# Patient Record
Sex: Female | Born: 1990 | Race: White | Hispanic: No | Marital: Single | Smoking: Current every day smoker
Health system: Southern US, Community
[De-identification: ages and names within clinical notes are randomized; demographics above are authoritative.]

## PROBLEM LIST (undated history)

## (undated) HISTORY — PX: INDUCED ABORTION: SHX677

---
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
## 2012-09-08 ENCOUNTER — Ambulatory Visit: Payer: Self-pay | Admitting: Physician Assistant

## 2012-09-08 VITALS — BP 112/73 | HR 77 | Temp 97.8°F | Resp 16 | Ht 64.0 in | Wt 107.4 lb

## 2012-09-08 DIAGNOSIS — Z20818 Contact with and (suspected) exposure to other bacterial communicable diseases: Secondary | ICD-10-CM

## 2012-09-08 DIAGNOSIS — B86 Scabies: Secondary | ICD-10-CM

## 2012-09-08 MED ORDER — AMOXICILLIN 500 MG PO CAPS: ORAL_CAPSULE | ORAL | Status: DC

## 2012-09-08 MED ORDER — PERMETHRIN 5 % EX CREA: TOPICAL_CREAM | Freq: Once | CUTANEOUS | Status: DC

## 2012-09-08 NOTE — Progress Notes (Signed)
  Subjective:    Patient ID: Vanessa Gallagher, female    DOB: [birth year], 22 y.o.   MRN: [id number]  HPI  22 yr old presents with recurrent scabies. Everyone in the household hasnt been treated at the same time.  She is here with her sister today who is also being seen for sore throat.  Review of Systems  All other systems reviewed and are negative.       Objective:   Physical Exam  Nursing note and vitals reviewed. Constitutional: She is oriented to person, place, and time. She appears well-developed and well-nourished.  HENT:  Head: Normocephalic and atraumatic.  Cardiovascular: Normal rate, regular rhythm and normal heart sounds.   Pulmonary/Chest: Effort normal and breath sounds normal.  Neurological: She is alert and oriented to person, place, and time.  Skin: Rash noted.  Pin point rash and burrows present on fingers and hands        Assessment & Plan:  Scabies- Zyrtec 1 daily for itching. Scabies h/o given. Sister is + for strep pharyngitis.  I am sending Kamren a prescription of amoxicillin to take if she gets symptoms. Meds ordered this encounter  Medications  . permethrin (ELIMITE) 5 % cream    Sig: Apply topically once.    Dispense:  60 g    Refill:  3    Order Specific Question:  Supervising Provider    Answer:  DOOLITTLE, ROBERT P [[id number]]  . amoxicillin (AMOXIL) 500 MG capsule    Sig: 2 tabs bid    Dispense:  40 capsule    Refill:  0    Order Specific Question:  Supervising Provider    Answer:  DOOLITTLE, ROBERT P [[id number]]

## 2012-09-08 NOTE — Patient Instructions (Addendum)
Zyrtec for itching.  Follow scabies h/o instructions.

## 2016-09-24 ENCOUNTER — Encounter (HOSPITAL_COMMUNITY): Payer: Self-pay | Admitting: Family Medicine

## 2016-09-24 ENCOUNTER — Ambulatory Visit (HOSPITAL_COMMUNITY)
Admission: EM | Admit: 2016-09-24 | Discharge: 2016-09-24 | Disposition: A | Discharge status: Home / Self Care | Payer: Medicaid Other | Attending: Internal Medicine | Admitting: Internal Medicine

## 2016-09-24 DIAGNOSIS — Z3202 Encounter for pregnancy test, result negative: Secondary | ICD-10-CM

## 2016-09-24 DIAGNOSIS — N390 Urinary tract infection, site not specified: Secondary | ICD-10-CM

## 2016-09-24 LAB — POCT URINALYSIS DIP (DEVICE)
GLUCOSE, UA: NEGATIVE mg/dL
Ketones, ur: 40 mg/dL — AB
NITRITE: NEGATIVE
Protein, ur: 100 mg/dL — AB
Specific Gravity, Urine: 1.025 (ref 1.005–1.030)
UROBILINOGEN UA: 0.2 mg/dL (ref 0.0–1.0)
pH: 5.5 (ref 5.0–8.0)

## 2016-09-24 LAB — POCT PREGNANCY, URINE: PREG TEST UR: NEGATIVE

## 2016-09-24 MED ORDER — FLUCONAZOLE 150 MG PO TABS: 150.0000 mg | ORAL_TABLET | Freq: Every day | ORAL | 1 refills | Status: DC

## 2016-09-24 MED ORDER — AMOXICILLIN 500 MG PO CAPS: 500.0000 mg | ORAL_CAPSULE | Freq: Three times a day (TID) | ORAL | 0 refills | Status: DC

## 2016-09-24 NOTE — ED Provider Notes (Signed)
CSN: [id number]     Arrival date & time 09/24/16  1718 History   None    Chief Complaint  Patient presents with  . Back Pain  . Chills  . Fever   (Consider location/radiation/quality/duration/timing/severity/associated sxs/prior Treatment) The history is provided by the patient. No language interpreter was used.  Fever  Associated symptoms: dysuria   Dysuria  Pain quality:  Aching Pain severity:  Moderate Onset quality:  Gradual Timing:  Constant Progression:  Worsening Chronicity:  New Recent urinary tract infections: yes   Relieved by:  Nothing Worsened by:  Nothing Ineffective treatments:  None tried Urinary symptoms: frequent urination   Associated symptoms: flank pain   Associated symptoms: no abdominal pain   Risk factors: no sexually transmitted infections   Pt complains of burning with urination  History reviewed. No pertinent past medical history. History reviewed. No pertinent surgical history. History reviewed. No pertinent family history. Social History  Substance Use Topics  . Smoking status: Current Every Day Smoker  . Smokeless tobacco: Never Used  . Alcohol use 3.6 oz/week    6 Cans of beer per week   OB History    Gravida Para Term Preterm AB Living   1             SAB TAB Ectopic Multiple Live Births                 Review of Systems  Gastrointestinal: Negative for abdominal pain.  Genitourinary: Positive for dysuria and flank pain.  All other systems reviewed and are negative.   Allergies  Patient has no known allergies.  Home Medications   Prior to Admission medications   Medication Sig Start Date End Date Taking? Authorizing Provider  amoxicillin (AMOXIL) 500 MG capsule Take 1 capsule (500 mg total) by mouth 3 (three) times daily. 09/24/16   Elson AreasLeslie K Sofia, PA-C   Meds Ordered and Administered this Visit  Medications - No data to display  BP 120/75   Pulse 112   Temp 97.8 F (36.6 C)   Resp 18   LMP 09/03/2016   SpO2 100%  No  data found.   Physical Exam  Constitutional: She is oriented to person, place, and time. She appears well-developed and well-nourished.  HENT:  Head: Normocephalic.  Eyes: EOM are normal.  Neck: Normal range of motion.  Cardiovascular: Normal rate.   Pulmonary/Chest: Effort normal.  Abdominal: Soft. She exhibits no distension. There is no tenderness.  Musculoskeletal: Normal range of motion.  Neurological: She is alert and oriented to person, place, and time.  Psychiatric: She has a normal mood and affect.  Nursing note and vitals reviewed.   Urgent Care Course     Procedures (including critical care time)  Labs Review Labs Reviewed  POCT URINALYSIS DIP (DEVICE) - Abnormal; Notable for the following:       Result Value   Bilirubin Urine SMALL (*)    Ketones, ur 40 (*)    Hgb urine dipstick SMALL (*)    Protein, ur 100 (*)    Leukocytes, UA TRACE (*)    All other components within normal limits  POCT PREGNANCY, URINE    Imaging Review No results found.   Visual Acuity Review  Right Eye Distance:   Left Eye Distance:   Bilateral Distance:    Right Eye Near:   Left Eye Near:    Bilateral Near:         MDM Pt encouraged to drink fluids.  1. Lower urinary tract infectious disease    Meds ordered this encounter  Medications  . amoxicillin (AMOXIL) 500 MG capsule    Sig: Take 1 capsule (500 mg total) by mouth 3 (three) times daily.    Dispense:  30 capsule    Refill:  0    Order Specific Question:   Supervising Provider    Answer:   Linna Hoff [phone]  An After Visit Summary was printed and given to the patient.     Lonia Skinner [place], PA-C 09/24/16 [phone]

## 2016-09-24 NOTE — Discharge Instructions (Signed)
Return if any problems.

## 2016-10-24 ENCOUNTER — Ambulatory Visit (HOSPITAL_COMMUNITY)
Admission: EM | Admit: 2016-10-24 | Discharge: 2016-10-24 | Disposition: A | Discharge status: Home / Self Care | Payer: Self-pay | Attending: Family Medicine | Admitting: Family Medicine

## 2016-10-24 ENCOUNTER — Encounter (HOSPITAL_COMMUNITY): Payer: Self-pay | Admitting: Emergency Medicine

## 2016-10-24 DIAGNOSIS — B86 Scabies: Secondary | ICD-10-CM

## 2016-10-24 MED ORDER — IVERMECTIN 3 MG PO TABS: 12.0000 mg | ORAL_TABLET | ORAL | 0 refills | Status: DC

## 2016-10-24 MED ORDER — PERMETHRIN 5 % EX CREA: TOPICAL_CREAM | CUTANEOUS | 0 refills | Status: DC

## 2016-10-24 MED ORDER — PREDNISONE 20 MG PO TABS: ORAL_TABLET | ORAL | 0 refills | Status: DC

## 2016-10-24 NOTE — ED Provider Notes (Signed)
[facility]    CSN: [id number] Arrival date & time: 10/24/16  1652     History   Chief Complaint Chief Complaint  Patient presents with  . Rash    HPI Vanessa Gallagher is a 26 y.o. female.   Sit 26 year old woman who presents with with an itchy rash.  4 years ago, she was diagnosed with scabies. The rash now involves her forearms, her hands including interdigital spaces, her back, in her abdomen and groin.      History reviewed. No pertinent past medical history.  There are no active problems to display for this patient.   History reviewed. No pertinent surgical history.  OB History    Gravida Para Term Preterm AB Living   1             SAB TAB Ectopic Multiple Live Births                   Home Medications    Prior to Admission medications   Medication Sig Start Date End Date Taking? Authorizing Provider  ivermectin (STROMECTOL) 3 MG TABS tablet Take 4 tablets (12 mg total) by mouth once a week. 10/24/16   Elvina Sidle, MD  permethrin Verner Mould) 5 % cream Apply to affected area once 10/24/16   Elvina Sidle, MD  predniSONE (DELTASONE) 20 MG tablet Two daily with food 10/24/16   Elvina Sidle, MD    Family History No family history on file.  Social History Social History  Substance Use Topics  . Smoking status: Current Every Day Smoker  . Smokeless tobacco: Never Used  . Alcohol use 3.6 oz/week    6 Cans of beer per week     Allergies   Patient has no known allergies.   Review of Systems Review of Systems  Skin: Positive for rash.  All other systems reviewed and are negative.    Physical Exam Triage Vital Signs ED Triage Vitals [10/24/16 1716]  Enc Vitals Group     BP 122/82     Pulse Rate (!) 103     Resp 18     Temp 99 F (37.2 C)     Temp Source Oral     SpO2 (!) 78 %     Weight      Height      Head Circumference      Peak Flow      Pain Score      Pain Loc      Pain Edu?      Excl. in GC?    No data  found.   Updated Vital Signs BP 122/82 (BP Location: Left Arm)   Pulse (!) 103   Temp 99 F (37.2 C) (Oral)   Resp 18   LMP 10/03/2016   SpO2 (!) 78%    Physical Exam  Constitutional: She is oriented to person, place, and time. She appears well-developed and well-nourished.  HENT:  Head: Normocephalic.  Right Ear: External ear normal.  Left Ear: External ear normal.  Mouth/Throat: Oropharynx is clear and moist.  Eyes: Conjunctivae and EOM are normal. Pupils are equal, round, and reactive to light.  Neck: Normal range of motion. Neck supple.  Pulmonary/Chest: Effort normal.  Musculoskeletal: Normal range of motion.  Neurological: She is alert and oriented to person, place, and time.  Skin: Skin is warm and dry. Rash noted.  Linear rash on both forearms, back, and abdomen  Nursing note and vitals reviewed.    UC  Treatments / Results  Labs (all labs ordered are listed, but only abnormal results are displayed) Labs Reviewed - No data to display  EKG  EKG Interpretation None       Radiology No results found.  Procedures Procedures (including critical care time)  Medications Ordered in UC Medications - No data to display   Initial Impression / Assessment and Plan / UC Course  I have reviewed the triage vital signs and the nursing notes.  Pertinent labs & imaging results that were available during my care of the patient were reviewed by me and considered in my medical decision making (see chart for details).     Final Clinical Impressions(s) / UC Diagnoses   Final diagnoses:  Scabies    New Prescriptions New Prescriptions   IVERMECTIN (STROMECTOL) 3 MG TABS TABLET    Take 4 tablets (12 mg total) by mouth once a week.   PERMETHRIN (ELIMITE) 5 % CREAM    Apply to affected area once   PREDNISONE (DELTASONE) 20 MG TABLET    Two daily with food     Elvina Sidle, MD 10/24/16 1727

## 2016-10-24 NOTE — ED Triage Notes (Signed)
Itchy rash that started 2 days ago, reports friend has this .  She has a history of scabies

## 2017-07-15 NOTE — L&D Delivery Note (Addendum)
Delivery Note:  [id number] Called in room to see patient, effective maternal pushing efforts noted. SVE: 10/100/+3, vertex.   [id number] Spontaneous vaginal delivery of liveborn female infant in LOA position. Infant immediately to maternal abdomen. Delayed cord clamping. Three (3) vessel cord, cord blood collected. APGARS: 8, 9. Weight: pending. Receiving nurse present at beside for birth.   Pitocin infusing fundus firn. First degree perineal, left labial, and right periurethral laceration repaired with 3-0 vicryl rapide under epidural anesthesia. Avulsion of cord with gentle traction. Attempt for manual extraction of placenta x 2 without success.   [id number] Dr. Valentino Saxonherry call for manual extraction of placenta.   0502 Dr. Valentino Saxonherry in room for bedside evaluation.   [id number] Manual removal of placenta by Dr. Valentino Saxonherry, see note. 800 mcg cytotec placed rectally.   Vault check completed. Counts correct x 2. QBL: pending.   Initiate routine postpartum care and orders. Mom to postpartum.  Baby to Couplet care / Skin to Skin.  FOB, patient's grandmother, and two (2) doulas present at bedside and overjoyed with the birth of "KimballtonGriffin".    Gunnar BullaJenkins Michelle Lawhorn, CNM [facility], [id number] 02/05/2018, 5:44 AM

## 2017-08-25 ENCOUNTER — Ambulatory Visit (INDEPENDENT_AMBULATORY_CARE_PROVIDER_SITE_OTHER): Payer: Medicaid Other | Admitting: Certified Nurse Midwife

## 2017-08-25 ENCOUNTER — Encounter: Payer: Self-pay | Admitting: Certified Nurse Midwife

## 2017-08-25 ENCOUNTER — Encounter: Payer: Self-pay | Admitting: Advanced Practice Midwife

## 2017-08-25 ENCOUNTER — Other Ambulatory Visit: Payer: Self-pay | Admitting: Certified Nurse Midwife

## 2017-08-25 ENCOUNTER — Ambulatory Visit (INDEPENDENT_AMBULATORY_CARE_PROVIDER_SITE_OTHER): Payer: Medicaid Other

## 2017-08-25 VITALS — BP 114/74 | HR 98 | Wt 127.7 lb

## 2017-08-25 DIAGNOSIS — O0930 Supervision of pregnancy with insufficient antenatal care, unspecified trimester: Secondary | ICD-10-CM

## 2017-08-25 DIAGNOSIS — Z23 Encounter for immunization: Secondary | ICD-10-CM

## 2017-08-25 DIAGNOSIS — Z3689 Encounter for other specified antenatal screening: Secondary | ICD-10-CM | POA: Diagnosis not present

## 2017-08-25 DIAGNOSIS — Z3492 Encounter for supervision of normal pregnancy, unspecified, second trimester: Secondary | ICD-10-CM | POA: Diagnosis not present

## 2017-08-25 DIAGNOSIS — Z124 Encounter for screening for malignant neoplasm of cervix: Secondary | ICD-10-CM

## 2017-08-25 NOTE — Progress Notes (Signed)
[facility]B -[facility]- pt is doing well, no records with her today

## 2017-08-25 NOTE — Patient Instructions (Signed)
Round Ligament Pain The round ligament is a cord of muscle and tissue that helps to support the uterus. It can become a source of pain during pregnancy if it becomes stretched or twisted as the baby grows. The pain usually begins in the second trimester of pregnancy, and it can come and go until the baby is delivered. It is not a serious problem, and it does not cause harm to the baby. Round ligament pain is usually a short, sharp, and pinching pain, but it can also be a dull, lingering, and aching pain. The pain is felt in the lower side of the abdomen or in the groin. It usually starts deep in the groin and moves up to the outside of the hip area. Pain can occur with:  A sudden change in position.  Rolling over in bed.  Coughing or sneezing.  Physical activity.  Follow these instructions at home: Watch your condition for any changes. Take these steps to help with your pain:  When the pain starts, relax. Then try: ? Sitting down. ? Flexing your knees up to your abdomen. ? Lying on your side with one pillow under your abdomen and another pillow between your legs. ? Sitting in a warm bath for 15-20 minutes or until the pain goes away.  Take over-the-counter and prescription medicines only as told by your health care provider.  Move slowly when you sit and stand.  Avoid long walks if they cause pain.  Stop or lessen your physical activities if they cause pain.  Contact a health care provider if:  Your pain does not go away with treatment.  You feel pain in your back that you did not have before.  Your medicine is not helping. Get help right away if:  You develop a fever or chills.  You develop uterine contractions.  You develop vaginal bleeding.  You develop nausea or vomiting.  You develop diarrhea.  You have pain when you urinate. This information is not intended to replace advice given to you by your health care provider. Make sure you discuss any questions you have  with your health care provider. Document Released: 04/09/2008 Document Revised: 12/07/2015 Document Reviewed: 09/07/2014 Elsevier Interactive Patient Education  2018 [place] for Pregnant Women While you are pregnant, your body will require additional nutrition to help support your growing baby. It is recommended that you consume:  150 additional calories each day during your first trimester.  300 additional calories each day during your second trimester.  300 additional calories each day during your third trimester.  Eating a healthy, well-balanced diet is very important for your health and for your baby's health. You also have a higher need for some vitamins and minerals, such as folic acid, calcium, iron, and vitamin D. What do I need to know about eating during pregnancy?  Do not try to lose weight or go on a diet during pregnancy.  Choose healthy, nutritious foods. Choose  of a sandwich with a glass of milk instead of a candy bar or a high-calorie sugar-sweetened beverage.  Limit your overall intake of foods that have "empty calories." These are foods that have little nutritional value, such as sweets, desserts, candies, sugar-sweetened beverages, and fried foods.  Eat a variety of foods, especially fruits and vegetables.  Take a prenatal vitamin to help meet the additional needs during pregnancy, specifically for folic acid, iron, calcium, and vitamin D.  Remember to stay active. Ask your health care provider for exercise recommendations  that are specific to you.  Practice good food safety and cleanliness, such as washing your hands before you eat and after you prepare raw meat. This helps to prevent foodborne illnesses, such as listeriosis, that can be very dangerous for your baby. Ask your health care provider for more information about listeriosis. What does 150 extra calories look like? Healthy options for an additional 150 calories each day could be any of  the following:  Plain low-fat yogurt (6-8 oz) with  cup of berries.  1 apple with 2 teaspoons of peanut butter.  Cut-up vegetables with  cup of hummus.  Low-fat chocolate milk (8 oz or 1 cup).  1 string cheese with 1 medium orange.   of a peanut butter and jelly sandwich on whole-wheat bread (1 tsp of peanut butter).  For 300 calories, you could eat two of those healthy options each day. What is a healthy amount of weight to gain? The recommended amount of weight for you to gain is based on your pre-pregnancy BMI. If your pre-pregnancy BMI was:  Less than 18 (underweight), you should gain 28-40 lb.  18-24.9 (normal), you should gain 25-35 lb.  25-29.9 (overweight), you should gain 15-25 lb.  Greater than 30 (obese), you should gain 11-20 lb.  What if I am having twins or multiples? Generally, pregnant women who will be having twins or multiples may need to increase their daily calories by 300-600 calories each day. The recommended range for total weight gain is 25-54 lb, depending on your pre-pregnancy BMI. Talk with your health care provider for specific guidance about additional nutritional needs, weight gain, and exercise during your pregnancy. What foods can I eat? Grains Any grains. Try to choose whole grains, such as whole-wheat bread, oatmeal, or brown rice. Vegetables Any vegetables. Try to eat a variety of colors and types of vegetables to get a full range of vitamins and minerals. Remember to wash your vegetables well before eating. Fruits Any fruits. Try to eat a variety of colors and types of fruit to get a full range of vitamins and minerals. Remember to wash your fruits well before eating. Meats and Other Protein Sources Lean meats, including chicken, [place], fish, and lean cuts of beef, veal, or pork. Make sure that all meats are cooked to "well done." Tofu. Tempeh. Beans. Eggs. Peanut butter and other nut butters. Seafood, such as shrimp, crab, and lobster. If  you choose fish, select types that are higher in omega-3 fatty acids, including salmon, herring, mussels, trout, sardines, and pollock. Make sure that all meats are cooked to food-safe temperatures. Dairy Pasteurized milk and milk alternatives. Pasteurized yogurt and pasteurized cheese. Cottage cheese. Sour cream. Beverages Water. Juices that contain 100% fruit juice or vegetable juice. Caffeine-free teas and decaffeinated coffee. Drinks that contain caffeine are okay to drink, but it is better to avoid caffeine. Keep your total caffeine intake to less than 200 mg each day (12 oz of coffee, tea, or soda) or as directed by your health care provider. Condiments Any pasteurized condiments. Sweets and Desserts Any sweets and desserts. Fats and Oils Any fats and oils. The items listed above may not be a complete list of recommended foods or beverages. Contact your dietitian for more options. What foods are not recommended? Vegetables Unpasteurized (raw) vegetable juices. Fruits Unpasteurized (raw) fruit juices. Meats and Other Protein Sources Cured meats that have nitrates, such as bacon, salami, and hotdogs. Luncheon meats, bologna, or other deli meats (unless they are reheated until they  are steaming hot). Refrigerated pate, meat spreads from a meat counter, smoked seafood that is found in the refrigerated section of a store. Raw fish, such as sushi or sashimi. High mercury content fish, such as tilefish, shark, swordfish, and king mackerel. Raw meats, such as tuna or beef tartare. Undercooked meats and poultry. Make sure that all meats are cooked to food-safe temperatures. Dairy Unpasteurized (raw) milk and any foods that have raw milk in them. Soft cheeses, such as feta, queso blanco, queso fresco, Brie, Camembert cheeses, blue-veined cheeses, and Panela cheese (unless it is made with pasteurized milk, which must be stated on the label). Beverages Alcohol. Sugar-sweetened beverages, such as  sodas, teas, or energy drinks. Condiments Homemade fermented foods and drinks, such as pickles, sauerkraut, or kombucha drinks. (Store-bought pasteurized versions of these are okay.) Other Salads that are made in the store, such as ham salad, chicken salad, egg salad, tuna salad, and seafood salad. The items listed above may not be a complete list of foods and beverages to avoid. Contact your dietitian for more information. This information is not intended to replace advice given to you by your health care provider. Make sure you discuss any questions you have with your health care provider. Document Released: 04/15/2014 Document Revised: 12/07/2015 Document Reviewed: 12/14/2013 Elsevier Interactive Patient Education  2018 [place]. Back Pain in Pregnancy Back pain during pregnancy is common. Back pain may be caused by several factors that are related to changes during your pregnancy. Follow these instructions at home: Managing pain, stiffness, and swelling  If directed, apply ice for sudden (acute) back pain. ? Put ice in a plastic bag. ? Place a towel between your skin and the bag. ? Leave the ice on for 20 minutes, 2-3 times per day.  If directed, apply heat to the affected area before you exercise: ? Place a towel between your skin and the heat pack or heating pad. ? Leave the heat on for 20-30 minutes. ? Remove the heat if your skin turns bright red. This is especially important if you are unable to feel pain, heat, or cold. You may have a greater risk of getting burned. Activity  Exercise as told by your health care provider. Exercising is the best way to prevent or manage back pain.  Listen to your body when lifting. If lifting hurts, ask for help or bend your knees. This uses your leg muscles instead of your back muscles.  Squat down when picking up something from the floor. Do not bend over.  Only use bed rest as told by your health care provider. Bed rest should only be  used for the most severe episodes of back pain. Standing, Sitting, and Lying Down  Do not stand in one place for long periods of time.  Use good posture when sitting. Make sure your head rests over your shoulders and is not hanging forward. Use a pillow on your lower back if necessary.  Try sleeping on your side, preferably the left side, with a pillow or two between your legs. If you are sore after a night's rest, your bed may be too soft. A firm mattress may provide more support for your back during pregnancy. General instructions  Do not wear high heels.  Eat a healthy diet. Try to gain weight within your health care provider's recommendations.  Use a maternity girdle, elastic sling, or back brace as told by your health care provider.  Take over-the-counter and prescription medicines only as told by your  health care provider.  Keep all follow-up visits as told by your health care provider. This is important. This includes any visits with any specialists, such as a physical therapist. Contact a health care provider if:  Your back pain interferes with your daily activities.  You have increasing pain in other parts of your body. Get help right away if:  You develop numbness, tingling, weakness, or problems with the use of your arms or legs.  You develop severe back pain that is not controlled with medicine.  You have a sudden change in bowel or bladder control.  You develop shortness of breath, dizziness, or you faint.  You develop nausea, vomiting, or sweating.  You have back pain that is a rhythmic, cramping pain similar to labor pains. Labor pain is usually 1-2 minutes apart, lasts for about 1 minute, and involves a bearing down feeling or pressure in your pelvis.  You have back pain and your water breaks or you have vaginal bleeding.  You have back pain or numbness that travels down your leg.  Your back pain developed after you fell.  You develop pain on one side of  your back.  You see blood in your urine.  You develop skin blisters in the area of your back pain. This information is not intended to replace advice given to you by your health care provider. Make sure you discuss any questions you have with your health care provider. Document Released: 10/09/2005 Document Revised: 12/07/2015 Document Reviewed: 03/15/2015 Elsevier Interactive Patient Education  2018 [employer]. Morning Sickness Morning sickness is when you feel sick to your stomach (nauseous) during pregnancy. You may feel sick to your stomach and throw up (vomit). You may feel sick in the morning, but you can feel this way any time of day. Some women feel very sick to their stomach and cannot stop throwing up (hyperemesis gravidarum). Follow these instructions at home:  Only take medicines as told by your doctor.  Take multivitamins as told by your doctor. Taking multivitamins before getting pregnant can stop or lessen the harshness of morning sickness.  Eat dry toast or unsalted crackers before getting out of bed.  Eat 5 to 6 small meals a day.  Eat dry and bland foods like rice and baked potatoes.  Do not drink liquids with meals. Drink between meals.  Do not eat greasy, fatty, or spicy foods.  Have someone cook for you if the smell of food causes you to feel sick or throw up.  If you feel sick to your stomach after taking prenatal vitamins, take them at night or with a snack.  Eat protein when you need a snack (nuts, yogurt, cheese).  Eat unsweetened gelatins for dessert.  Wear a bracelet used for sea sickness (acupressure wristband).  Go to a doctor that puts thin needles into certain body points (acupuncture) to improve how you feel.  Do not smoke.  Use a humidifier to keep the air in your house free of odors.  Get lots of fresh air. Contact a doctor if:  You need medicine to feel better.  You feel dizzy or lightheaded.  You are losing weight. Get help  right away if:  You feel very sick to your stomach and cannot stop throwing up.  You pass out (faint). This information is not intended to replace advice given to you by your health care provider. Make sure you discuss any questions you have with your health care provider. Document Released: 08/08/2004 Document Revised: 12/07/2015  Document Reviewed: 12/16/2012 Elsevier Interactive Patient Education  2017 [place]. Common Medications Safe in Pregnancy  Acne:      Constipation:  Benzoyl Peroxide     Colace  Clindamycin      Dulcolax Suppository  Topica Erythromycin     Fibercon  Salicylic Acid      Metamucil         Miralax AVOID:        Senakot   Accutane    Cough:  Retin-A       Cough Drops  Tetracycline      Phenergan w/ Codeine if Rx  Minocycline      Robitussin (Plain & DM)  Antibiotics:     Crabs/Lice:  Ceclor       RID  Cephalosporins    AVOID:  E-Mycins      Kwell  Keflex  Macrobid/Macrodantin   Diarrhea:  Penicillin      Kao-Pectate  Zithromax      Imodium AD         PUSH FLUIDS AVOID:       Cipro     Fever:  Tetracycline      Tylenol (Regular or Extra  Minocycline       Strength)  Levaquin      Extra Strength-Do not          Exceed 8 tabs/24 hrs Caffeine:        <272m/day (equiv. To 1 cup of coffee or  approx. 3 12 oz sodas)         Gas: Cold/Hayfever:       Gas-X  Benadryl      Mylicon  Claritin       Phazyme  **Claritin-D        Chlor-Trimeton    Headaches:  Dimetapp      ASA-Free Excedrin  Drixoral-Non-Drowsy     Cold Compress  Mucinex (Guaifenasin)     Tylenol (Regular or Extra  Sudafed/Sudafed-12 Hour     Strength)  **Sudafed PE Pseudoephedrine   Tylenol Cold & Sinus     Vicks Vapor Rub  Zyrtec  **AVOID if Problems With Blood Pressure         Heartburn: Avoid lying down for at least 1 hour after meals  Aciphex      Maalox     Rash:  Milk of Magnesia     Benadryl    Mylanta       1% Hydrocortisone Cream  Pepcid  Pepcid  Complete   Sleep Aids:  Prevacid      Ambien   Prilosec       Benadryl  Rolaids       Chamomile Tea  Tums (Limit 4/day)     Unisom  Zantac       Tylenol PM         Warm milk-add vanilla or  Hemorrhoids:       Sugar for taste  Anusol/Anusol H.C.  (RX: Analapram 2.5%)  Sugar Substitutes:  Hydrocortisone OTC     Ok in moderation  Preparation H      Tucks        Vaseline lotion applied to tissue with wiping    Herpes:     Throat:  Acyclovir      Oragel  Famvir  Valtrex     Vaccines:         Flu Shot Leg Cramps:       *Gardasil  Benadryl      Hepatitis A  Hepatitis B Nasal Spray:       Pneumovax  Saline Nasal Spray     Polio Booster         Tetanus Nausea:       Tuberculosis test or PPD  Vitamin B6 25 mg TID   AVOID:    Dramamine      *Gardasil  Emetrol       Live Poliovirus  Ginger Root 250 mg QID    MMR (measles, mumps &  High Complex Carbs @ Bedtime    rebella)  Sea Bands-Accupressure    Varicella (Chickenpox)  Unisom 1/2 tab TID     *No known complications           If received before Pain:         Known pregnancy;   Darvocet       Resume series after  Lortab        Delivery  Percocet    Yeast:   Tramadol      Femstat  Tylenol 3      Gyne-lotrimin  Ultram       Monistat  Vicodin           MISC:         All Sunscreens           Hair Coloring/highlights          Insect Repellant's          (Including DEET)         Mystic Tans Second Trimester of Pregnancy The second trimester is from week 13 through week 28, month 4 through 6. This is often the time in pregnancy that you feel your best. Often times, morning sickness has lessened or quit. You may have more energy, and you may get hungry more often. Your unborn baby (fetus) is growing rapidly. At the end of the sixth month, he or she is about 9 inches long and weighs about 1 pounds. You will likely feel the baby move (quickening) between 18 and 20 weeks of pregnancy. Follow these instructions at home:  Avoid  all smoking, herbs, and alcohol. Avoid drugs not approved by your doctor.  Do not use any tobacco products, including cigarettes, chewing tobacco, and electronic cigarettes. If you need help quitting, ask your doctor. You may get counseling or other support to help you quit.  Only take medicine as told by your doctor. Some medicines are safe and some are not during pregnancy.  Exercise only as told by your doctor. Stop exercising if you start having cramps.  Eat regular, healthy meals.  Wear a good support bra if your breasts are tender.  Do not use hot tubs, steam rooms, or saunas.  Wear your seat belt when driving.  Avoid raw meat, uncooked cheese, and liter boxes and soil used by cats.  Take your prenatal vitamins.  Take 1500-2000 milligrams of calcium daily starting at the 20th week of pregnancy until you deliver your baby.  Try taking medicine that helps you poop (stool softener) as needed, and if your doctor approves. Eat more fiber by eating fresh fruit, vegetables, and whole grains. Drink enough fluids to keep your pee (urine) clear or pale yellow.  Take warm water baths (sitz baths) to soothe pain or discomfort caused by hemorrhoids. Use hemorrhoid cream if your doctor approves.  If you have puffy, bulging veins (varicose veins), wear support hose. Raise (elevate) your feet for 15 minutes, 3-4 times a day. Limit salt in your diet.  Avoid heavy lifting, wear low heals, and sit up straight.  Rest with your legs raised if you have leg cramps or low back pain.  Visit your dentist if you have not gone during your pregnancy. Use a soft toothbrush to brush your teeth. Be gentle when you floss.  You can have sex (intercourse) unless your doctor tells you not to.  Go to your doctor visits. Get help if:  You feel dizzy.  You have mild cramps or pressure in your lower belly (abdomen).  You have a nagging pain in your belly area.  You continue to feel sick to your stomach  (nauseous), throw up (vomit), or have watery poop (diarrhea).  You have bad smelling fluid coming from your vagina.  You have pain with peeing (urination). Get help right away if:  You have a fever.  You are leaking fluid from your vagina.  You have spotting or bleeding from your vagina.  You have severe belly cramping or pain.  You lose or gain weight rapidly.  You have trouble catching your breath and have chest pain.  You notice sudden or extreme puffiness (swelling) of your face, hands, ankles, feet, or legs.  You have not felt the baby move in over an hour.  You have severe headaches that do not go away with medicine.  You have vision changes. This information is not intended to replace advice given to you by your health care provider. Make sure you discuss any questions you have with your health care provider. Document Released: 09/25/2009 Document Revised: 12/07/2015 Document Reviewed: 09/01/2012 Elsevier Interactive Patient Education  2017 [employer].

## 2017-08-25 NOTE — Progress Notes (Signed)
TRANSFER IN OB HISTORY AND PHYSICAL  SUBJECTIVE:       Vanessa Gallagher is a 27 y.o. G39P0 female, Patient's last menstrual period was 04/21/2017., Estimated Date of Delivery: 01/26/18, [redacted]w[redacted]d, presents today for Transition of Prenatal Care.   She presents without records and is aware that labs will be collected today.   Denies difficulty breathing or respiratory distress, chest pain, abdominal pain, vaginal bleeding, dysuria, and leg pain or swelling.   She is taking a prenatal vitamin with folic acid and DHA. She declines genetic screening.    Gynecologic History  Patient's last menstrual period was 04/21/2017.   Contraception: none  Last Pap: due. Results were: normal  Obstetric History  OB History  Gravida Para Term Preterm AB Living  2            SAB TAB Ectopic Multiple Live Births               # Outcome Date GA Lbr Len/2nd Weight Sex Delivery Anes PTL Lv  2 Current           1 Gravida              Birth Comments: System Generated. Please review and update pregnancy details.      History reviewed. No pertinent past medical history.  History reviewed. No pertinent surgical history.  Current Outpatient Medications on File Prior to Visit  Medication Sig Dispense Refill  . ivermectin (STROMECTOL) 3 MG TABS tablet Take 4 tablets (12 mg total) by mouth once a week. (Patient not taking: Reported on 08/25/2017) 8 tablet 0  . permethrin (ELIMITE) 5 % cream Apply to affected area once (Patient not taking: Reported on 08/25/2017) 60 g 0  . predniSONE (DELTASONE) 20 MG tablet Two daily with food (Patient not taking: Reported on 08/25/2017) 10 tablet 0   No current facility-administered medications on file prior to visit.     No Known Allergies  Social History   Socioeconomic History  . Marital status: Single    Spouse name: Not on file  . Number of children: Not on file  . Years of education: Not on file  . Highest education level: Not on file  Social Needs  . Financial  resource strain: Not on file  . Food insecurity - worry: Not on file  . Food insecurity - inability: Not on file  . Transportation needs - medical: Not on file  . Transportation needs - non-medical: Not on file  Occupational History  . Not on file  Tobacco Use  . Smoking status: Former Games developer  . Smokeless tobacco: Never Used  Substance and Sexual Activity  . Alcohol use: Yes    Alcohol/week: 3.6 oz    Types: 6 Cans of beer per week  . Drug use: No  . Sexual activity: Yes    Birth control/protection: None  Other Topics Concern  . Not on file  Social History Narrative  . Not on file    History reviewed. No pertinent family history.  The following portions of the patient's history were reviewed and updated as appropriate: allergies, current medications, past OB history, past medical history, past surgical history, past family history, past social history, and problem list.  OBJECTIVE:  BP 114/74   Pulse 98   Wt 127 lb 11.2 oz (57.9 kg)   LMP 04/21/2017   BMI 21.92 kg/m   Initial Physical Exam (New OB)  GENERAL APPEARANCE: alert, well appearing, in no apparent distress  HEAD: normocephalic, atraumatic  MOUTH: mucous membranes moist, pharynx normal without lesions  THYROID: no thyromegaly or masses present  BREASTS: patient declined exam  LUNGS: clear to auscultation, no wheezes, rales or rhonchi, symmetric air entry  HEART: regular rate and rhythm, no murmurs  ABDOMEN: soft, nontender, nondistended, no abnormal masses, no epigastric pain and FHT present  EXTREMITIES: no redness or tenderness in the calves or thighs, no edema  SKIN: normal coloration and turgor, no rashes  LYMPH NODES: no adenopathy palpable  NEUROLOGIC: alert, oriented, normal speech, no focal findings or movement disorder noted  PELVIC EXAM EXTERNAL GENITALIA: normal appearing vulva with no masses, tenderness or lesions VAGINA: no abnormal discharge or lesions CERVIX: no lesions or  cervical motion tenderness and Pap collected UTERUS: gravid ADNEXA: no masses palpable and nontender OB EXAM PELVIMETRY: appears adequate  ASSESSMENT: Normal pregnancy Late entry prenatal care Declines genetic screening  PLAN: Prenatal care NOB labs and Pap today, see orders.  New OB counseling: The patient has been given an overview regarding routine prenatal care. Recommendations regarding diet, weight gain, and exercise in pregnancy were given. Prenatal testing, optional genetic testing, and ultrasound use in pregnancy were reviewed.  Benefits of Breast Feeding were discussed. The patient is encouraged to consider nursing her baby post partum. See orders   Gunnar BullaJenkins Michelle Camyra Vaeth, CNM [facility]

## 2017-08-26 LAB — HEPATITIS B SURFACE ANTIGEN: HEP B S AG: NEGATIVE

## 2017-08-26 LAB — URINALYSIS, ROUTINE W REFLEX MICROSCOPIC
BILIRUBIN UA: NEGATIVE
Glucose, UA: NEGATIVE
KETONES UA: NEGATIVE
NITRITE UA: NEGATIVE
PH UA: 7.5 (ref 5.0–7.5)
Protein, UA: NEGATIVE
RBC UA: NEGATIVE
SPEC GRAV UA: 1.008 (ref 1.005–1.030)
Urobilinogen, Ur: 0.2 mg/dL (ref 0.2–1.0)

## 2017-08-26 LAB — MICROSCOPIC EXAMINATION
Bacteria, UA: NONE SEEN
Casts: NONE SEEN /lpf

## 2017-08-26 LAB — CBC WITH DIFFERENTIAL/PLATELET
BASOS ABS: 0 10*3/uL (ref 0.0–0.2)
Basos: 0 %
EOS (ABSOLUTE): 0.2 10*3/uL (ref 0.0–0.4)
Eos: 3 %
HEMATOCRIT: 35.1 % (ref 34.0–46.6)
HEMOGLOBIN: 11.9 g/dL (ref 11.1–15.9)
Immature Grans (Abs): 0 10*3/uL (ref 0.0–0.1)
Immature Granulocytes: 0 %
Lymphocytes Absolute: 1.6 10*3/uL (ref 0.7–3.1)
Lymphs: 25 %
MCH: 32.2 pg (ref 26.6–33.0)
MCHC: 33.9 g/dL (ref 31.5–35.7)
MCV: 95 fL (ref 79–97)
MONOCYTES: 11 %
MONOS ABS: 0.7 10*3/uL (ref 0.1–0.9)
NEUTROS ABS: 3.9 10*3/uL (ref 1.4–7.0)
Neutrophils: 61 %
Platelets: 231 10*3/uL (ref 150–379)
RBC: 3.7 x10E6/uL — ABNORMAL LOW (ref 3.77–5.28)
RDW: 12.9 % (ref 12.3–15.4)
WBC: 6.4 10*3/uL (ref 3.4–10.8)

## 2017-08-26 LAB — VARICELLA ZOSTER ANTIBODY, IGG: Varicella zoster IgG: 1168 index (ref >165)

## 2017-08-26 LAB — HIV ANTIBODY (ROUTINE TESTING W REFLEX): HIV Screen 4th Generation wRfx: NONREACTIVE

## 2017-08-26 LAB — RUBELLA SCREEN: Rubella Antibodies, IGG: 1.83 index (ref >0.99)

## 2017-08-26 LAB — ABO AND RH: Rh Factor: POSITIVE

## 2017-08-26 LAB — ANTIBODY SCREEN: ANTIBODY SCREEN: NEGATIVE

## 2017-08-26 LAB — RPR: RPR Ser Ql: NONREACTIVE

## 2017-08-27 LAB — PAP IG, RFX HPV ASCU,16/18: PAP SMEAR COMMENT: 0

## 2017-08-27 LAB — GC/CHLAMYDIA PROBE AMP
Chlamydia trachomatis, NAA: NEGATIVE
Neisseria gonorrhoeae by PCR: NEGATIVE

## 2017-08-27 LAB — URINE CULTURE: Organism ID, Bacteria: NO GROWTH

## 2017-08-28 LAB — CANNABINOID (GC/MS), URINE
CANNABINOID UR: POSITIVE — AB
CARBOXY THC UR: 56 ng/mL

## 2017-08-28 LAB — MONITOR DRUG PROFILE 14(MW)
Amphetamine Scrn, Ur: NEGATIVE ng/mL
BARBITURATE SCREEN URINE: NEGATIVE ng/mL
BENZODIAZEPINE SCREEN, URINE: NEGATIVE ng/mL
BUPRENORPHINE, URINE: NEGATIVE ng/mL
COCAINE(METAB.)SCREEN, URINE: NEGATIVE ng/mL
CREATININE(CRT), U: 31.2 mg/dL (ref 20.0–300.0)
Fentanyl, Urine: NEGATIVE pg/mL
MEPERIDINE SCREEN, URINE: NEGATIVE ng/mL
METHADONE SCREEN, URINE: NEGATIVE ng/mL
OPIATE SCREEN URINE: NEGATIVE ng/mL
OXYCODONE+OXYMORPHONE UR QL SCN: NEGATIVE ng/mL
PHENCYCLIDINE QUANTITATIVE URINE: NEGATIVE ng/mL
Ph of Urine: 7.1 (ref 4.5–8.9)
Propoxyphene Scrn, Ur: NEGATIVE ng/mL
SPECIFIC GRAVITY: 1.009
TRAMADOL SCREEN, URINE: NEGATIVE ng/mL

## 2017-08-29 ENCOUNTER — Encounter: Payer: Self-pay | Admitting: Certified Nurse Midwife

## 2017-08-29 ENCOUNTER — Other Ambulatory Visit: Payer: Self-pay | Admitting: Certified Nurse Midwife

## 2017-08-29 DIAGNOSIS — F1290 Cannabis use, unspecified, uncomplicated: Secondary | ICD-10-CM | POA: Insufficient documentation

## 2017-08-29 DIAGNOSIS — A599 Trichomoniasis, unspecified: Secondary | ICD-10-CM | POA: Insufficient documentation

## 2017-08-29 NOTE — Progress Notes (Signed)
Please contact patient. Pap normal expect Trich. Needs Flaygl 2 gm PO x 1 dose, please sent to nearest pharmacy. Blood type O positive. Positive marijuana, advise against smoking in pregnancy. Encourage to activate MyChart. Thanks, JML

## 2017-09-01 ENCOUNTER — Telehealth: Payer: Self-pay | Admitting: Certified Nurse Midwife

## 2017-09-01 ENCOUNTER — Telehealth: Payer: Self-pay

## 2017-09-01 MED ORDER — METRONIDAZOLE 500 MG PO TABS: 2000.0000 mg | ORAL_TABLET | Freq: Once | ORAL | 0 refills | Status: AC

## 2017-09-01 NOTE — Telephone Encounter (Signed)
-----   Message from Gunnar BullaJenkins Michelle Lawhorn, CNM sent at 08/29/2017  2:02 PM EST ----- Please contact patient. Pap normal expect Trich. Needs Flaygl 2 gm PO x 1 dose, please sent to nearest pharmacy. Blood type O positive. Positive marijuana, advise against smoking in pregnancy. Encourage to activate MyChart. Thanks, JML

## 2017-09-01 NOTE — Telephone Encounter (Signed)
Patient called stating she was returning a call she received on Friday.

## 2017-09-01 NOTE — Telephone Encounter (Signed)
Spoke with pt. See lab results.

## 2017-09-01 NOTE — Telephone Encounter (Signed)
Can remember who I sent this message? Anyone call this patient on Friday? JML

## 2017-09-22 ENCOUNTER — Encounter: Payer: Self-pay | Admitting: Certified Nurse Midwife

## 2017-09-22 ENCOUNTER — Ambulatory Visit (INDEPENDENT_AMBULATORY_CARE_PROVIDER_SITE_OTHER): Payer: Medicaid Other | Admitting: Certified Nurse Midwife

## 2017-09-22 VITALS — BP 107/68 | HR 77 | Wt 136.6 lb

## 2017-09-22 DIAGNOSIS — Z3482 Encounter for supervision of other normal pregnancy, second trimester: Secondary | ICD-10-CM

## 2017-09-22 DIAGNOSIS — Z8619 Personal history of other infectious and parasitic diseases: Secondary | ICD-10-CM

## 2017-09-22 LAB — POCT URINALYSIS DIPSTICK
BILIRUBIN UA: NEGATIVE
Blood, UA: NEGATIVE
GLUCOSE UA: NEGATIVE
KETONES UA: NEGATIVE
Leukocytes, UA: NEGATIVE
NITRITE UA: NEGATIVE
Protein, UA: NEGATIVE
SPEC GRAV UA: 1.015 (ref 1.010–1.025)
Urobilinogen, UA: 0.2 E.U./dL
pH, UA: 7.5 (ref 5.0–8.0)

## 2017-09-22 NOTE — Patient Instructions (Signed)

## 2017-09-22 NOTE — Progress Notes (Signed)
ROB is doing well no concerns.  

## 2017-09-22 NOTE — Progress Notes (Signed)
ROB, doing well. No complaints. TOC test today. Discussed 28 wk. Labs. Pt verbalizes understanding and agreement to plan of care. Feels good fetal movement. Follow up 4 wks.   Doreene BurkeAnnie Corrinne Benegas, CNM

## 2017-09-25 LAB — TRICHOMONAS VAGINALIS, PROBE AMP: Trich vag by NAA: NEGATIVE

## 2017-09-29 ENCOUNTER — Encounter: Payer: Self-pay | Admitting: Certified Nurse Midwife

## 2017-10-15 ENCOUNTER — Telehealth: Payer: Self-pay | Admitting: Obstetrics and Gynecology

## 2017-10-15 NOTE — Telephone Encounter (Signed)
The patient called and stated that she is not feeling any fetal movement, and is concerned. The patient would like to speak with a nurse as soon as possible. Please advise.

## 2017-10-16 NOTE — Telephone Encounter (Signed)
Called pt we discussed her not feeling baby move states after she called us she felt movement we went over when to call us back pt voiced understanding

## 2017-10-22 ENCOUNTER — Encounter: Payer: Medicaid Other | Admitting: Obstetrics and Gynecology

## 2017-10-28 ENCOUNTER — Ambulatory Visit (INDEPENDENT_AMBULATORY_CARE_PROVIDER_SITE_OTHER): Payer: Medicaid Other | Admitting: Obstetrics and Gynecology

## 2017-10-28 VITALS — BP 110/68 | HR 72 | Wt 143.1 lb

## 2017-10-28 DIAGNOSIS — Z3492 Encounter for supervision of normal pregnancy, unspecified, second trimester: Secondary | ICD-10-CM

## 2017-10-28 DIAGNOSIS — Z131 Encounter for screening for diabetes mellitus: Secondary | ICD-10-CM

## 2017-10-28 DIAGNOSIS — Z130 Encounter for screening for diseases of the blood and blood-forming organs and certain disorders involving the immune mechanism: Secondary | ICD-10-CM

## 2017-10-28 DIAGNOSIS — Z23 Encounter for immunization: Secondary | ICD-10-CM

## 2017-10-28 LAB — POCT URINALYSIS DIPSTICK
Bilirubin, UA: NEGATIVE
Glucose, UA: NEGATIVE
Ketones, UA: NEGATIVE
LEUKOCYTES UA: NEGATIVE
NITRITE UA: NEGATIVE
PROTEIN UA: NEGATIVE
RBC UA: NEGATIVE
SPEC GRAV UA: 1.01 (ref 1.010–1.025)
Urobilinogen, UA: 0.2 E.U./dL
pH, UA: 6.5 (ref 5.0–8.0)

## 2017-10-28 MED ORDER — TETANUS-DIPHTH-ACELL PERTUSSIS 5-2.5-18.5 LF-MCG/0.5 IM SUSP: 0.5000 mL | Freq: Once | INTRAMUSCULAR | Status: DC

## 2017-10-28 MED ORDER — TETANUS-DIPHTH-ACELL PERTUSSIS 5-2.5-18.5 LF-MCG/0.5 IM SUSP
0.5000 mL | Freq: Once | INTRAMUSCULAR | Status: AC
  Administered 2017-10-28: 0.5 mL via INTRAMUSCULAR

## 2017-10-28 NOTE — Progress Notes (Signed)
ROB- glucola done, blood consent signed, tdap given, pt is doing well 

## 2017-10-28 NOTE — Progress Notes (Signed)
ROB & glucola, discussed classes,

## 2017-10-28 NOTE — Addendum Note (Signed)
Addended by: Rosine BeatLONTZ, Demecia Northway L on: 10/28/2017 02:16 PM   Modules accepted: Orders

## 2017-10-28 NOTE — Patient Instructions (Signed)

## 2017-10-29 LAB — CBC
HEMOGLOBIN: 10.8 g/dL — AB (ref 11.1–15.9)
Hematocrit: 32.2 % — ABNORMAL LOW (ref 34.0–46.6)
MCH: 30.9 pg (ref 26.6–33.0)
MCHC: 33.5 g/dL (ref 31.5–35.7)
MCV: 92 fL (ref 79–97)
PLATELETS: 188 10*3/uL (ref 150–379)
RBC: 3.5 x10E6/uL — AB (ref 3.77–5.28)
RDW: 13.4 % (ref 12.3–15.4)
WBC: 9.2 10*3/uL (ref 3.4–10.8)

## 2017-10-29 LAB — RPR: RPR: NONREACTIVE

## 2017-10-29 LAB — GLUCOSE, 1 HOUR GESTATIONAL: GESTATIONAL DIABETES SCREEN: 82 mg/dL (ref 65–139)

## 2017-11-03 ENCOUNTER — Other Ambulatory Visit: Payer: Self-pay | Admitting: Obstetrics and Gynecology

## 2017-11-03 MED ORDER — CITRANATAL BLOOM 90-1 MG PO TABS: 1.0000 | ORAL_TABLET | Freq: Every day | ORAL | 11 refills | Status: DC

## 2017-11-11 ENCOUNTER — Encounter: Payer: Self-pay | Admitting: Certified Nurse Midwife

## 2017-11-11 ENCOUNTER — Ambulatory Visit (INDEPENDENT_AMBULATORY_CARE_PROVIDER_SITE_OTHER): Payer: Medicaid Other | Admitting: Certified Nurse Midwife

## 2017-11-11 VITALS — BP 110/67 | HR 87 | Wt 145.0 lb

## 2017-11-11 DIAGNOSIS — Z3483 Encounter for supervision of other normal pregnancy, third trimester: Secondary | ICD-10-CM

## 2017-11-11 LAB — POCT URINALYSIS DIPSTICK
Bilirubin, UA: NEGATIVE
Blood, UA: NEGATIVE
GLUCOSE UA: NEGATIVE
Ketones, UA: NEGATIVE
LEUKOCYTES UA: NEGATIVE
Nitrite, UA: NEGATIVE
PH UA: 5 (ref 5.0–8.0)
PROTEIN UA: NEGATIVE
Spec Grav, UA: 1.02 (ref 1.010–1.025)
Urobilinogen, UA: 0.2 E.U./dL

## 2017-11-11 NOTE — Progress Notes (Signed)
ROB, doing well. Discussed medication on med list for indigestion . She also complaint of insomnia which she had before pregnancy . Discussed use of Unisom. She feels good fetal movement. Denies contractions. She will follow up in 2 wks.   Doreene Burke, CNM

## 2017-11-11 NOTE — Progress Notes (Signed)
Pt is here for an ROB visit. C/o acid reflux.

## 2017-11-11 NOTE — Patient Instructions (Signed)

## 2017-11-25 ENCOUNTER — Ambulatory Visit (INDEPENDENT_AMBULATORY_CARE_PROVIDER_SITE_OTHER): Payer: Medicaid Other | Admitting: Obstetrics and Gynecology

## 2017-11-25 VITALS — BP 118/71 | HR 107 | Wt 149.9 lb

## 2017-11-25 DIAGNOSIS — Z3493 Encounter for supervision of normal pregnancy, unspecified, third trimester: Secondary | ICD-10-CM

## 2017-11-25 MED ORDER — RANITIDINE HCL 150 MG PO TABS: 150.0000 mg | ORAL_TABLET | Freq: Two times a day (BID) | ORAL | 4 refills | Status: DC

## 2017-11-25 MED ORDER — LORATADINE 10 MG PO TABS: 10.0000 mg | ORAL_TABLET | Freq: Every day | ORAL | 3 refills | Status: DC

## 2017-11-25 NOTE — Progress Notes (Signed)
ROB- pt is doing well 

## 2017-11-25 NOTE — Patient Instructions (Signed)
Heartburn During Pregnancy Heartburn is pain or discomfort in the throat or chest. It may cause a burning feeling. It happens when stomach acid moves up into the tube that carries food from your mouth to your stomach (esophagus). Heartburn is common during pregnancy. It usually goes away or gets better after giving birth. Follow these instructions at home: Eating and drinking  Do not drink alcohol while you are pregnant.  Figure out which foods and beverages make you feel worse, and avoid them.  Beverages that you may want to avoid include: ? Coffee and tea (with or without caffeine). ? Energy drinks and sports drinks. ? Bubbly (carbonated) drinks or sodas. ? Citrus fruit juices.  Foods that you may want to avoid include: ? Chocolate and cocoa. ? Peppermint and mint flavorings. ? Garlic, onions, and horseradish. ? Spicy and acidic foods. These include peppers, chili powder, curry powder, vinegar, hot sauces, and barbecue sauce. ? Citrus fruits, such as oranges, lemons, and limes. ? Tomato-based foods, such as red sauce, chili, and salsa. ? Fried and fatty foods, such as donuts, french fries, potato chips, and high-fat dressings. ? High-fat meats, such as hot dogs, cold cuts, sausage, ham, and bacon. ? High-fat dairy items, such as whole milk, butter, and cheese.  Eat small meals often, instead of large meals.  Avoid drinking a lot of liquid with your meals.  Avoid eating meals during the 2-3 hours before you go to bed.  Avoid lying down right after you eat.  Do not exercise right after you eat. Medicines  Take over-the-counter and prescription medicines only as told by your doctor.  Do not take aspirin, ibuprofen, or other NSAIDs unless your doctor tells you to do that.  Your doctor may tell you to avoid medicines that have sodium bicarbonate in them. General instructions  If told, raise the head of your bed about 6 inches (15 cm). You can do this by putting blocks under  the legs. Sleeping with more pillows does not help with heartburn.  Do not use any products that contain nicotine or tobacco, such as cigarettes and e-cigarettes. If you need help quitting, ask your doctor.  Wear loose-fitting clothing.  Try to lower your stress, such as with yoga or meditation. If you need help, ask your doctor.  Stay at a healthy weight. If you are overweight, work with your doctor to safely lose weight.  Keep all follow-up visits as told by your doctor. This is important. Contact a doctor if:  You get new symptoms.  Your symptoms do not get better with treatment.  You have weight loss and you do not know why.  You have trouble swallowing.  You make loud sounds when you breathe (wheeze).  You have a cough that does not go away.  You have heartburn often for more than 2 weeks.  You feel sick to your stomach (nauseous), and this does not get better with treatment.  You are throwing up (vomiting), and this does not get better with treatment.  You have pain in your belly (abdomen). Get help right away if:  You have very bad chest pain that spreads to your arm, neck, or jaw.  You feel sweaty, dizzy, or light-headed.  You have trouble breathing.  You have pain when swallowing.  You throw up and your throw-up looks like blood or coffee grounds.  Your poop (stool) is bloody or black. This information is not intended to replace advice given to you by your health care provider.   Make sure you discuss any questions you have with your health care provider. Document Released: 08/03/2010 Document Revised: 03/18/2016 Document Reviewed: 03/18/2016 Elsevier Interactive Patient Education  2017 Elsevier Inc.  

## 2017-11-25 NOTE — Progress Notes (Signed)
ROB-doing well except allergies and heartburn.  claritin and zantac recommended, rx sent in

## 2017-12-09 ENCOUNTER — Ambulatory Visit (INDEPENDENT_AMBULATORY_CARE_PROVIDER_SITE_OTHER): Payer: Medicaid Other | Admitting: Certified Nurse Midwife

## 2017-12-09 VITALS — BP 127/71 | HR 102 | Wt 148.1 lb

## 2017-12-09 DIAGNOSIS — O99013 Anemia complicating pregnancy, third trimester: Secondary | ICD-10-CM

## 2017-12-09 DIAGNOSIS — Z3483 Encounter for supervision of other normal pregnancy, third trimester: Secondary | ICD-10-CM

## 2017-12-09 LAB — POCT URINALYSIS DIPSTICK
Bilirubin, UA: NEGATIVE
Blood, UA: NEGATIVE
Glucose, UA: NEGATIVE
Ketones, UA: NEGATIVE
LEUKOCYTES UA: NEGATIVE
NITRITE UA: NEGATIVE
PROTEIN UA: NEGATIVE
Spec Grav, UA: 1.02 (ref 1.010–1.025)
Urobilinogen, UA: 0.2 E.U./dL
pH, UA: 6.5 (ref 5.0–8.0)

## 2017-12-09 NOTE — Patient Instructions (Signed)
Vaginal Delivery Vaginal delivery means that you will give birth by pushing your baby out of your birth canal (vagina). A team of health care providers will help you before, during, and after vaginal delivery. Birth experiences are unique for every woman and every pregnancy, and birth experiences vary depending on where you choose to give birth. What should I do to prepare for my baby's birth? Before your baby is born, it is important to talk with your health care provider about:  Your labor and delivery preferences. These may include: ? Medicines that you may be given. ? How you will manage your pain. This might include non-medical pain relief techniques or injectable pain relief such as epidural analgesia. ? How you and your baby will be monitored during labor and delivery. ? Who may be in the labor and delivery room with you. ? Your feelings about surgical delivery of your baby (cesarean delivery, or C-section) if this becomes necessary. ? Your feelings about receiving donated blood through an IV tube (blood transfusion) if this becomes necessary.  Whether you are able: ? To take pictures or videos of the birth. ? To eat during labor and delivery. ? To move around, walk, or change positions during labor and delivery.  What to expect after your baby is born, such as: ? Whether delayed umbilical cord clamping and cutting is offered. ? Who will care for your baby right after birth. ? Medicines or tests that may be recommended for your baby. ? Whether breastfeeding is supported in your hospital or birth center. ? How long you will be in the hospital or birth center.  How any medical conditions you have may affect your baby or your labor and delivery experience.  To prepare for your baby's birth, you should also:  Attend all of your health care visits before delivery (prenatal visits) as recommended by your health care provider. This is important.  Prepare your home for your baby's  arrival. Make sure that you have: ? Diapers. ? Baby clothing. ? Feeding equipment. ? Safe sleeping arrangements for you and your baby.  Install a car seat in your vehicle. Have your car seat checked by a certified car seat installer to make sure that it is installed safely.  Think about who will help you with your new baby at home for at least the first several weeks after delivery.  What can I expect when I arrive at the birth center or hospital? Once you are in labor and have been admitted into the hospital or birth center, your health care provider may:  Review your pregnancy history and any concerns you have.  Insert an IV tube into one of your veins. This is used to give you fluids and medicines.  Check your blood pressure, pulse, temperature, and heart rate (vital signs).  Check whether your bag of water (amniotic sac) has broken (ruptured).  Talk with you about your birth plan and discuss pain control options.  Monitoring Your health care provider may monitor your contractions (uterine monitoring) and your baby's heart rate (fetal monitoring). You may need to be monitored:  Often, but not continuously (intermittently).  All the time or for long periods at a time (continuously). Continuous monitoring may be needed if: ? You are taking certain medicines, such as medicine to relieve pain or make your contractions stronger. ? You have pregnancy or labor complications.  Monitoring may be done by:  Placing a special stethoscope or a handheld monitoring device on your abdomen to   check your baby's heartbeat, and feeling your abdomen for contractions. This method of monitoring does not continuously record your baby's heartbeat or your contractions.  Placing monitors on your abdomen (external monitors) to record your baby's heartbeat and the frequency and length of contractions. You may not have to wear external monitors all the time.  Placing monitors inside of your uterus  (internal monitors) to record your baby's heartbeat and the frequency, length, and strength of your contractions. ? Your health care provider may use internal monitors if he or she needs more information about the strength of your contractions or your baby's heart rate. ? Internal monitors are put in place by passing a thin, flexible wire through your vagina and into your uterus. Depending on the type of monitor, it may remain in your uterus or on your baby's head until birth. ? Your health care provider will discuss the benefits and risks of internal monitoring with you and will ask for your permission before inserting the monitors.  Telemetry. This is a type of continuous monitoring that can be done with external or internal monitors. Instead of having to stay in bed, you are able to move around during telemetry. Ask your health care provider if telemetry is an option for you.  Physical exam Your health care provider may perform a physical exam. This may include:  Checking whether your baby is positioned: ? With the head toward your vagina (head-down). This is most common. ? With the head toward the top of your uterus (head-up or breech). If your baby is in a breech position, your health care provider may try to turn your baby to a head-down position so you can deliver vaginally. If it does not seem that your baby can be born vaginally, your provider may recommend surgery to deliver your baby. In rare cases, you may be able to deliver vaginally if your baby is head-up (breech delivery). ? Lying sideways (transverse). Babies that are lying sideways cannot be delivered vaginally.  Checking your cervix to determine: ? Whether it is thinning out (effacing). ? Whether it is opening up (dilating). ? How low your baby has moved into your birth canal.  What are the three stages of labor and delivery?  Normal labor and delivery is divided into the following three stages: Stage 1  Stage 1 is the  longest stage of labor, and it can last for hours or days. Stage 1 includes: ? Early labor. This is when contractions may be irregular, or regular and mild. Generally, early labor contractions are more than 10 minutes apart. ? Active labor. This is when contractions get longer, more regular, more frequent, and more intense. ? The transition phase. This is when contractions happen very close together, are very intense, and may last longer than during any other part of labor.  Contractions generally feel mild, infrequent, and irregular at first. They get stronger, more frequent (about every 2-3 minutes), and more regular as you progress from early labor through active labor and transition.  Many women progress through stage 1 naturally, but you may need help to continue making progress. If this happens, your health care provider may talk with you about: ? Rupturing your amniotic sac if it has not ruptured yet. ? Giving you medicine to help make your contractions stronger and more frequent.  Stage 1 ends when your cervix is completely dilated to 4 inches (10 cm) and completely effaced. This happens at the end of the transition phase. Stage 2  Once   your cervix is completely effaced and dilated to 4 inches (10 cm), you may start to feel an urge to push. It is common for the body to naturally take a rest before feeling the urge to push, especially if you received an epidural or certain other pain medicines. This rest period may last for up to 1-2 hours, depending on your unique labor experience.  During stage 2, contractions are generally less painful, because pushing helps relieve contraction pain. Instead of contraction pain, you may feel stretching and burning pain, especially when the widest part of your baby's head passes through the vaginal opening (crowning).  Your health care provider will closely monitor your pushing progress and your baby's progress through the vagina during stage 2.  Your  health care provider may massage the area of skin between your vaginal opening and anus (perineum) or apply warm compresses to your perineum. This helps it stretch as the baby's head starts to crown, which can help prevent perineal tearing. ? In some cases, an incision may be made in your perineum (episiotomy) to allow the baby to pass through the vaginal opening. An episiotomy helps to make the opening of the vagina larger to allow more room for the baby to fit through.  It is very important to breathe and focus so your health care provider can control the delivery of your baby's head. Your health care provider may have you decrease the intensity of your pushing, to help prevent perineal tearing.  After delivery of your baby's head, the shoulders and the rest of the body generally deliver very quickly and without difficulty.  Once your baby is delivered, the umbilical cord may be cut right away, or this may be delayed for 1-2 minutes, depending on your baby's health. This may vary among health care providers, hospitals, and birth centers.  If you and your baby are healthy enough, your baby may be placed on your chest or abdomen to help maintain the baby's temperature and to help you bond with each other. Some mothers and babies start breastfeeding at this time. Your health care team will dry your baby and help keep your baby warm during this time.  Your baby may need immediate care if he or she: ? Showed signs of distress during labor. ? Has a medical condition. ? Was born too early (prematurely). ? Had a bowel movement before birth (meconium). ? Shows signs of difficulty transitioning from being inside the uterus to being outside of the uterus. If you are planning to breastfeed, your health care team will help you begin a feeding. Stage 3  The third stage of labor starts immediately after the birth of your baby and ends after you deliver the placenta. The placenta is an organ that develops  during pregnancy to provide oxygen and nutrients to your baby in the womb.  Delivering the placenta may require some pushing, and you may have mild contractions. Breastfeeding can stimulate contractions to help you deliver the placenta.  After the placenta is delivered, your uterus should tighten (contract) and become firm. This helps to stop bleeding in your uterus. To help your uterus contract and to control bleeding, your health care provider may: ? Give you medicine by injection, through an IV tube, by mouth, or through your rectum (rectally). ? Massage your abdomen or perform a vaginal exam to remove any blood clots that are left in your uterus. ? Empty your bladder by placing a thin, flexible tube (catheter) into your bladder. ? Encourage   you to breastfeed your baby. After labor is over, you and your baby will be monitored closely to ensure that you are both healthy until you are ready to go home. Your health care team will teach you how to care for yourself and your baby. This information is not intended to replace advice given to you by your health care provider. Make sure you discuss any questions you have with your health care provider. Document Released: 04/09/2008 Document Revised: 01/19/2016 Document Reviewed: 07/16/2015 Elsevier Interactive Patient Education  2018 Elsevier Inc.  

## 2017-12-09 NOTE — Progress Notes (Signed)
Pt is here for an ROB visit. 

## 2017-12-10 DIAGNOSIS — O99019 Anemia complicating pregnancy, unspecified trimester: Secondary | ICD-10-CM | POA: Insufficient documentation

## 2017-12-10 NOTE — Progress Notes (Signed)
ROB-Reports tooth pain. List given of local dentist who accept Pregnancy Medicaid. Tried to sign up for childbirth classes, but they were all full. Pamphlet given for [facility]. Anticipatory guidance regarding course of prenatal care. Reviewed red flag symptoms and when to call. RTC x 2 weeks for ROB or sooner if needed.

## 2017-12-12 ENCOUNTER — Telehealth: Payer: Self-pay | Admitting: Certified Nurse Midwife

## 2017-12-12 NOTE — Telephone Encounter (Signed)
The patient called and stated that she would like to speak with Marcelino Duster in regards to open slots for a labor class she was informed about. The patient also stated that she has some questions about a dentist clearance letter in the voicemail. I already spoke with the patients dentist office and faxed that over about 30 minutes prior to sending this message back, and I also have a fax confirmation. Please advise.

## 2017-12-12 NOTE — Telephone Encounter (Signed)
pls advise

## 2017-12-15 ENCOUNTER — Telehealth: Payer: Self-pay | Admitting: Certified Nurse Midwife

## 2017-12-15 ENCOUNTER — Telehealth: Payer: Self-pay

## 2017-12-15 NOTE — Telephone Encounter (Signed)
Left pt detailed message.

## 2017-12-15 NOTE — Telephone Encounter (Signed)
Please advised patient that, per Gala LewandowskyJessica Brown, she may go to a "full" class at [facility] and be added to the list. Don't call. Don't try to register online. Just show up early to the desired class and they should add you to the roster. Thanks, JML

## 2017-12-15 NOTE — Telephone Encounter (Signed)
The patient called and lvm for Marcelino DusterMichelle to give her a call back in regards to the birthing classes. The patient stated that there are [place] longer [place] available [place]. [place] in [place] and she would like to know if she can still walk in and if there are any fees she may have to pay. Please advise.

## 2017-12-15 NOTE — Telephone Encounter (Signed)
Spoke with pt- reiterated providers instructions. Pt expressed understanding.

## 2017-12-15 NOTE — Telephone Encounter (Signed)
Please contact patient and let her know that Gala LewandowskyJessica Brown, the director of [employer] [employer] at [facility], said she could just show up for the class series she would like to attend. Thanks, JML

## 2017-12-18 ENCOUNTER — Telehealth: Payer: Self-pay | Admitting: Certified Nurse Midwife

## 2017-12-18 ENCOUNTER — Encounter: Payer: Self-pay | Admitting: *Deleted

## 2017-12-18 ENCOUNTER — Telehealth: Payer: Self-pay

## 2017-12-18 NOTE — Telephone Encounter (Signed)
The patient needs medical clearance for dental x-rays at her appt today at 12:30  Fax for her dentist is: [phone]  She asked for a call back that this has been sent as she is leaving shortly for her appointment.  Call her at [phone]  Thank you

## 2017-12-18 NOTE — Telephone Encounter (Signed)
The patient called and stated that's he would like to speak with a nurse in regards to her getting a clearance fax to get an x-ray at her dentist office, The patient stated she spoke with someone earlier and her appointment is now. The fax number is [phone]. Please advise.

## 2017-12-23 ENCOUNTER — Encounter: Payer: Self-pay | Admitting: Certified Nurse Midwife

## 2017-12-23 ENCOUNTER — Ambulatory Visit (INDEPENDENT_AMBULATORY_CARE_PROVIDER_SITE_OTHER): Payer: Medicaid Other | Admitting: Certified Nurse Midwife

## 2017-12-23 VITALS — BP 123/70 | HR 90 | Wt 154.3 lb

## 2017-12-23 DIAGNOSIS — Z3483 Encounter for supervision of other normal pregnancy, third trimester: Secondary | ICD-10-CM

## 2017-12-23 LAB — POCT URINALYSIS DIPSTICK
Bilirubin, UA: NEGATIVE
GLUCOSE UA: NEGATIVE
Ketones, UA: NEGATIVE
LEUKOCYTES UA: NEGATIVE
Nitrite, UA: NEGATIVE
Protein, UA: POSITIVE — AB
RBC UA: NEGATIVE
SPEC GRAV UA: 1.015 (ref 1.010–1.025)
Urobilinogen, UA: 0.2 E.U./dL
pH, UA: 7 (ref 5.0–8.0)

## 2017-12-23 NOTE — Progress Notes (Signed)
ROB, doing well,good fetal movement. Reports facial swelling with tooth pain,followed by a dentist and advised to make sooner appointment with them. Will start antibiotics prescribed by dentist for tooth extraction. GBS to be completed next week, anticipatory guidance given  Shanika Creacy,SNM/Makya Phillis,CNM

## 2017-12-23 NOTE — Patient Instructions (Signed)
Group B Streptococcus Infection During Pregnancy Group B Streptococcus (GBS) is a type of bacteria (Streptococcus agalactiae) that is often found in healthy people, commonly in the rectum, vagina, and intestines. In people who are healthy and not pregnant, the bacteria rarely cause serious illness or complications. However, women who test positive for GBS during pregnancy can pass the bacteria to their baby during childbirth, which can cause serious infection in the baby after birth. Women with GBS may also have infections during their pregnancy or immediately after childbirth, such as such as urinary tract infections (UTIs) or infections of the uterus (uterine infections). Having GBS also increases a woman's risk of complications during pregnancy, such as early (preterm) labor or delivery, miscarriage, or stillbirth. Routine testing (screening) for GBS is recommended for all pregnant women. What increases the risk? You may have a higher risk for GBS infection during pregnancy if you had one during a past pregnancy. What are the signs or symptoms? In most cases, GBS infection does not cause symptoms in pregnant women. Signs and symptoms of a possible GBS-related infection may include:  Labor starting before the 37th week of pregnancy.  A UTI or bladder infection, which may cause: ? Fever. ? Pain or burning during urination. ? Frequent urination.  Fever during labor, along with: ? Bad-smelling discharge. ? Uterine tenderness. ? Rapid heartbeat in the mother, baby, or both.  Rare but serious symptoms of a possible GBS-related infection in women include:  Blood infection (septicemia). This may cause fever, chills, or confusion.  Lung infection (pneumonia). This may cause fever, chills, cough, rapid breathing, difficulty breathing, or chest pain.  Bone, joint, skin, or soft tissue infection.  How is this diagnosed? You may be screened for GBS between week 35 and week 37 of your pregnancy. If  you have symptoms of preterm labor, you may be screened earlier. This condition is diagnosed based on lab test results from:  A swab of fluid from the vagina and rectum.  A urine sample.  How is this treated? This condition is treated with antibiotic medicine. When you go into labor, or as soon as your water breaks (your membranes rupture), you will be given antibiotics through an IV tube. Antibiotics will continue until after you give birth. If you are having a cesarean delivery, you do not need antibiotics unless your membranes have already ruptured. Follow these instructions at home:  Take over-the-counter and prescription medicines only as told by your health care provider.  Take your antibiotic medicine as told by your health care provider. Do not stop taking the antibiotic even if you start to feel better.  Keep all pre-birth (prenatal) visits and follow-up visits as told by your health care provider. This is important. Contact a health care provider if:  You have pain or burning when you urinate.  You have to urinate frequently.  You have a fever or chills.  You develop a bad-smelling vaginal discharge. Get help right away if:  Your membranes rupture.  You go into labor.  You have severe pain in your abdomen.  You have difficulty breathing.  You have chest pain. This information is not intended to replace advice given to you by your health care provider. Make sure you discuss any questions you have with your health care provider. Document Released: 10/08/2007 Document Revised: 01/26/2016 Document Reviewed: 01/25/2016 Elsevier Interactive Patient Education  2018 Elsevier Inc.  

## 2017-12-23 NOTE — Progress Notes (Signed)
Pt is here for an ROB visit. Wil be starting antibiotic due to tooth needing to be extracted.

## 2017-12-23 NOTE — Progress Notes (Signed)
ROB

## 2017-12-24 ENCOUNTER — Telehealth: Payer: Self-pay | Admitting: Obstetrics and Gynecology

## 2017-12-24 NOTE — Telephone Encounter (Signed)
Returned call to pharmacy   Vanessa BurkeAnnie Jamiaya Gallagher, CNM

## 2017-12-24 NOTE — Telephone Encounter (Signed)
Marylu LundJanet called from the pharmacy stating that the patient has a script for Tylenol w/ codeine and this is a contraindication during pregnancy and she needs a call back for verification.  Call back number for Marylu LundJanet is [phone], please advise, thanks.

## 2017-12-24 NOTE — Telephone Encounter (Signed)
I tried to call pharmacy back yesterday and was put on hold FOVEVER! Can you please call them , the script is from her DDS. Tylenol with codine in pregnancy is ok just makes sure she understands that the baby will get in blood stream. I dont anticipate it being a problem because it is for short term use ( abscess tooth).   Thanks,  Pattricia BossAnnie

## 2017-12-25 ENCOUNTER — Telehealth: Payer: Self-pay

## 2017-12-25 NOTE — Telephone Encounter (Signed)
[facility] Per [facility] please fill script for Tylenol with Codeine #3 per message 12/24/17. She states it was filled and pt was counseled.

## 2017-12-30 ENCOUNTER — Encounter: Payer: Self-pay | Admitting: Certified Nurse Midwife

## 2017-12-30 ENCOUNTER — Ambulatory Visit (INDEPENDENT_AMBULATORY_CARE_PROVIDER_SITE_OTHER): Payer: Medicaid Other | Admitting: Certified Nurse Midwife

## 2017-12-30 VITALS — BP 118/64 | HR 89 | Wt 160.0 lb

## 2017-12-30 DIAGNOSIS — Z3483 Encounter for supervision of other normal pregnancy, third trimester: Secondary | ICD-10-CM | POA: Diagnosis not present

## 2017-12-30 LAB — POCT URINALYSIS DIPSTICK
BILIRUBIN UA: NEGATIVE
GLUCOSE UA: NEGATIVE
KETONES UA: NEGATIVE
Leukocytes, UA: NEGATIVE
Nitrite, UA: NEGATIVE
Protein, UA: NEGATIVE
Spec Grav, UA: 1.02 (ref 1.010–1.025)
Urobilinogen, UA: 0.2 E.U./dL
pH, UA: 7 (ref 5.0–8.0)

## 2017-12-30 NOTE — Progress Notes (Signed)
ROB-Doing well, no questions or concerns. 36 week cultures collected. Anticipatory guidance regarding course of prenatal care. Reviewed red flag symptoms and when to call. RTC x 1 week for ROB or sooner if needed.  

## 2017-12-30 NOTE — Progress Notes (Signed)
Pt is here for an ROB visit. Due for cultures.

## 2017-12-30 NOTE — Patient Instructions (Signed)
Vaginal Delivery Vaginal delivery means that you will give birth by pushing your baby out of your birth canal (vagina). A team of health care providers will help you before, during, and after vaginal delivery. Birth experiences are unique for every woman and every pregnancy, and birth experiences vary depending on where you choose to give birth. What should I do to prepare for my baby's birth? Before your baby is born, it is important to talk with your health care provider about:  Your labor and delivery preferences. These may include: ? Medicines that you may be given. ? How you will manage your pain. This might include non-medical pain relief techniques or injectable pain relief such as epidural analgesia. ? How you and your baby will be monitored during labor and delivery. ? Who may be in the labor and delivery room with you. ? Your feelings about surgical delivery of your baby (cesarean delivery, or C-section) if this becomes necessary. ? Your feelings about receiving donated blood through an IV tube (blood transfusion) if this becomes necessary.  Whether you are able: ? To take pictures or videos of the birth. ? To eat during labor and delivery. ? To move around, walk, or change positions during labor and delivery.  What to expect after your baby is born, such as: ? Whether delayed umbilical cord clamping and cutting is offered. ? Who will care for your baby right after birth. ? Medicines or tests that may be recommended for your baby. ? Whether breastfeeding is supported in your hospital or birth center. ? How long you will be in the hospital or birth center.  How any medical conditions you have may affect your baby or your labor and delivery experience.  To prepare for your baby's birth, you should also:  Attend all of your health care visits before delivery (prenatal visits) as recommended by your health care provider. This is important.  Prepare your home for your baby's  arrival. Make sure that you have: ? Diapers. ? Baby clothing. ? Feeding equipment. ? Safe sleeping arrangements for you and your baby.  Install a car seat in your vehicle. Have your car seat checked by a certified car seat installer to make sure that it is installed safely.  Think about who will help you with your new baby at home for at least the first several weeks after delivery.  What can I expect when I arrive at the birth center or hospital? Once you are in labor and have been admitted into the hospital or birth center, your health care provider may:  Review your pregnancy history and any concerns you have.  Insert an IV tube into one of your veins. This is used to give you fluids and medicines.  Check your blood pressure, pulse, temperature, and heart rate (vital signs).  Check whether your bag of water (amniotic sac) has broken (ruptured).  Talk with you about your birth plan and discuss pain control options.  Monitoring Your health care provider may monitor your contractions (uterine monitoring) and your baby's heart rate (fetal monitoring). You may need to be monitored:  Often, but not continuously (intermittently).  All the time or for long periods at a time (continuously). Continuous monitoring may be needed if: ? You are taking certain medicines, such as medicine to relieve pain or make your contractions stronger. ? You have pregnancy or labor complications.  Monitoring may be done by:  Placing a special stethoscope or a handheld monitoring device on your abdomen to   check your baby's heartbeat, and feeling your abdomen for contractions. This method of monitoring does not continuously record your baby's heartbeat or your contractions.  Placing monitors on your abdomen (external monitors) to record your baby's heartbeat and the frequency and length of contractions. You may not have to wear external monitors all the time.  Placing monitors inside of your uterus  (internal monitors) to record your baby's heartbeat and the frequency, length, and strength of your contractions. ? Your health care provider may use internal monitors if he or she needs more information about the strength of your contractions or your baby's heart rate. ? Internal monitors are put in place by passing a thin, flexible wire through your vagina and into your uterus. Depending on the type of monitor, it may remain in your uterus or on your baby's head until birth. ? Your health care provider will discuss the benefits and risks of internal monitoring with you and will ask for your permission before inserting the monitors.  Telemetry. This is a type of continuous monitoring that can be done with external or internal monitors. Instead of having to stay in bed, you are able to move around during telemetry. Ask your health care provider if telemetry is an option for you.  Physical exam Your health care provider may perform a physical exam. This may include:  Checking whether your baby is positioned: ? With the head toward your vagina (head-down). This is most common. ? With the head toward the top of your uterus (head-up or breech). If your baby is in a breech position, your health care provider may try to turn your baby to a head-down position so you can deliver vaginally. If it does not seem that your baby can be born vaginally, your provider may recommend surgery to deliver your baby. In rare cases, you may be able to deliver vaginally if your baby is head-up (breech delivery). ? Lying sideways (transverse). Babies that are lying sideways cannot be delivered vaginally.  Checking your cervix to determine: ? Whether it is thinning out (effacing). ? Whether it is opening up (dilating). ? How low your baby has moved into your birth canal.  What are the three stages of labor and delivery?  Normal labor and delivery is divided into the following three stages: Stage 1  Stage 1 is the  longest stage of labor, and it can last for hours or days. Stage 1 includes: ? Early labor. This is when contractions may be irregular, or regular and mild. Generally, early labor contractions are more than 10 minutes apart. ? Active labor. This is when contractions get longer, more regular, more frequent, and more intense. ? The transition phase. This is when contractions happen very close together, are very intense, and may last longer than during any other part of labor.  Contractions generally feel mild, infrequent, and irregular at first. They get stronger, more frequent (about every 2-3 minutes), and more regular as you progress from early labor through active labor and transition.  Many women progress through stage 1 naturally, but you may need help to continue making progress. If this happens, your health care provider may talk with you about: ? Rupturing your amniotic sac if it has not ruptured yet. ? Giving you medicine to help make your contractions stronger and more frequent.  Stage 1 ends when your cervix is completely dilated to 4 inches (10 cm) and completely effaced. This happens at the end of the transition phase. Stage 2  Once   your cervix is completely effaced and dilated to 4 inches (10 cm), you may start to feel an urge to push. It is common for the body to naturally take a rest before feeling the urge to push, especially if you received an epidural or certain other pain medicines. This rest period may last for up to 1-2 hours, depending on your unique labor experience.  During stage 2, contractions are generally less painful, because pushing helps relieve contraction pain. Instead of contraction pain, you may feel stretching and burning pain, especially when the widest part of your baby's head passes through the vaginal opening (crowning).  Your health care provider will closely monitor your pushing progress and your baby's progress through the vagina during stage 2.  Your  health care provider may massage the area of skin between your vaginal opening and anus (perineum) or apply warm compresses to your perineum. This helps it stretch as the baby's head starts to crown, which can help prevent perineal tearing. ? In some cases, an incision may be made in your perineum (episiotomy) to allow the baby to pass through the vaginal opening. An episiotomy helps to make the opening of the vagina larger to allow more room for the baby to fit through.  It is very important to breathe and focus so your health care provider can control the delivery of your baby's head. Your health care provider may have you decrease the intensity of your pushing, to help prevent perineal tearing.  After delivery of your baby's head, the shoulders and the rest of the body generally deliver very quickly and without difficulty.  Once your baby is delivered, the umbilical cord may be cut right away, or this may be delayed for 1-2 minutes, depending on your baby's health. This may vary among health care providers, hospitals, and birth centers.  If you and your baby are healthy enough, your baby may be placed on your chest or abdomen to help maintain the baby's temperature and to help you bond with each other. Some mothers and babies start breastfeeding at this time. Your health care team will dry your baby and help keep your baby warm during this time.  Your baby may need immediate care if he or she: ? Showed signs of distress during labor. ? Has a medical condition. ? Was born too early (prematurely). ? Had a bowel movement before birth (meconium). ? Shows signs of difficulty transitioning from being inside the uterus to being outside of the uterus. If you are planning to breastfeed, your health care team will help you begin a feeding. Stage 3  The third stage of labor starts immediately after the birth of your baby and ends after you deliver the placenta. The placenta is an organ that develops  during pregnancy to provide oxygen and nutrients to your baby in the womb.  Delivering the placenta may require some pushing, and you may have mild contractions. Breastfeeding can stimulate contractions to help you deliver the placenta.  After the placenta is delivered, your uterus should tighten (contract) and become firm. This helps to stop bleeding in your uterus. To help your uterus contract and to control bleeding, your health care provider may: ? Give you medicine by injection, through an IV tube, by mouth, or through your rectum (rectally). ? Massage your abdomen or perform a vaginal exam to remove any blood clots that are left in your uterus. ? Empty your bladder by placing a thin, flexible tube (catheter) into your bladder. ? Encourage   you to breastfeed your baby. After labor is over, you and your baby will be monitored closely to ensure that you are both healthy until you are ready to go home. Your health care team will teach you how to care for yourself and your baby. This information is not intended to replace advice given to you by your health care provider. Make sure you discuss any questions you have with your health care provider. Document Released: 04/09/2008 Document Revised: 01/19/2016 Document Reviewed: 07/16/2015 Elsevier Interactive Patient Education  2018 Elsevier Inc.  

## 2017-12-30 NOTE — Addendum Note (Signed)
Addended by: Brooke DareSICK, Laure Leone L on: 12/30/2017 02:41 PM   Modules accepted: Orders

## 2018-01-01 LAB — GC/CHLAMYDIA PROBE AMP
Chlamydia trachomatis, NAA: NEGATIVE
NEISSERIA GONORRHOEAE BY PCR: NEGATIVE

## 2018-01-01 LAB — STREP GP B NAA: Strep Gp B NAA: NEGATIVE

## 2018-01-06 ENCOUNTER — Ambulatory Visit (INDEPENDENT_AMBULATORY_CARE_PROVIDER_SITE_OTHER): Payer: Medicaid Other | Admitting: Certified Nurse Midwife

## 2018-01-06 ENCOUNTER — Encounter: Payer: Self-pay | Admitting: Certified Nurse Midwife

## 2018-01-06 VITALS — BP 114/72 | HR 97 | Wt 159.6 lb

## 2018-01-06 DIAGNOSIS — Z3A40 40 weeks gestation of pregnancy: Secondary | ICD-10-CM

## 2018-01-06 DIAGNOSIS — Z3483 Encounter for supervision of other normal pregnancy, third trimester: Secondary | ICD-10-CM

## 2018-01-06 DIAGNOSIS — O368130 Decreased fetal movements, third trimester, not applicable or unspecified: Secondary | ICD-10-CM | POA: Diagnosis not present

## 2018-01-06 LAB — POCT URINALYSIS DIPSTICK
Bilirubin, UA: NEGATIVE
Blood, UA: NEGATIVE
Glucose, UA: NEGATIVE
KETONES UA: NEGATIVE
LEUKOCYTES UA: NEGATIVE
NITRITE UA: NEGATIVE
PH UA: 7 (ref 5.0–8.0)
PROTEIN UA: POSITIVE — AB
Spec Grav, UA: 1.015 (ref 1.010–1.025)
Urobilinogen, UA: 0.2 E.U./dL

## 2018-01-06 MED ORDER — FLUCONAZOLE 150 MG PO TABS: 150.0000 mg | ORAL_TABLET | Freq: Once | ORAL | 1 refills | Status: AC

## 2018-01-06 NOTE — Patient Instructions (Addendum)
Braxton Hicks Contractions °Contractions of the uterus can occur throughout pregnancy, but they are not always a sign that you are in labor. You may have practice contractions called Braxton Hicks contractions. These false labor contractions are sometimes confused with true labor. °What are Braxton Hicks contractions? °Braxton Hicks contractions are tightening movements that occur in the muscles of the uterus before labor. Unlike true labor contractions, these contractions do not result in opening (dilation) and thinning of the cervix. Toward the end of pregnancy (32-34 weeks), Braxton Hicks contractions can happen more often and may become stronger. These contractions are sometimes difficult to tell apart from true labor because they can be very uncomfortable. You should not feel embarrassed if you go to the hospital with false labor. °Sometimes, the only way to tell if you are in true labor is for your health care provider to look for changes in the cervix. The health care provider will do a physical exam and may monitor your contractions. If you are not in true labor, the exam should show that your cervix is not dilating and your water has not broken. °If there are other health problems associated with your pregnancy, it is completely safe for you to be sent home with false labor. You may continue to have Braxton Hicks contractions until you go into true labor. °How to tell the difference between true labor and false labor °True labor °· Contractions last 30-70 seconds. °· Contractions become very regular. °· Discomfort is usually felt in the top of the uterus, and it spreads to the lower abdomen and low back. °· Contractions do not go away with walking. °· Contractions usually become more intense and increase in frequency. °· The cervix dilates and gets thinner. °False labor °· Contractions are usually shorter and not as strong as true labor contractions. °· Contractions are usually irregular. °· Contractions  are often felt in the front of the lower abdomen and in the groin. °· Contractions may go away when you walk around or change positions while lying down. °· Contractions get weaker and are shorter-lasting as time goes on. °· The cervix usually does not dilate or become thin. °Follow these instructions at home: °· Take over-the-counter and prescription medicines only as told by your health care provider. °· Keep up with your usual exercises and follow other instructions from your health care provider. °· Eat and drink lightly if you think you are going into labor. °· If Braxton Hicks contractions are making you uncomfortable: °? Change your position from lying down or resting to walking, or change from walking to resting. °? Sit and rest in a tub of warm water. °? Drink enough fluid to keep your urine pale yellow. Dehydration may cause these contractions. °? Do slow and deep breathing several times an hour. °· Keep all follow-up prenatal visits as told by your health care provider. This is important. °Contact a health care provider if: °· You have a fever. °· You have continuous pain in your abdomen. °Get help right away if: °· Your contractions become stronger, more regular, and closer together. °· You have fluid leaking or gushing from your vagina. °· You pass blood-tinged mucus (bloody show). °· You have bleeding from your vagina. °· You have low back pain that you never had before. °· You feel your baby’s head pushing down and causing pelvic pressure. °· Your baby is not moving inside you as much as it used to. °Summary °· Contractions that occur before labor are called Braxton   Hicks contractions, false labor, or practice contractions. °· Braxton Hicks contractions are usually shorter, weaker, farther apart, and less regular than true labor contractions. True labor contractions usually become progressively stronger and regular and they become more frequent. °· Manage discomfort from Braxton Hicks contractions by  changing position, resting in a warm bath, drinking plenty of water, or practicing deep breathing. °This information is not intended to replace advice given to you by your health care provider. Make sure you discuss any questions you have with your health care provider. °Document Released: 11/14/2016 Document Revised: 11/14/2016 Document Reviewed: 11/14/2016 °Elsevier Interactive Patient Education © 2018 Elsevier Inc. ° °Fetal Movement Counts °Patient Name: ________________________________________________ Patient Due Date: ____________________ °What is a fetal movement count? °A fetal movement count is the number of times that you feel your baby move during a certain amount of time. This may also be called a fetal kick count. A fetal movement count is recommended for every pregnant woman. You may be asked to start counting fetal movements as early as week 28 of your pregnancy. °Pay attention to when your baby is most active. You may notice your baby's sleep and wake cycles. You may also notice things that make your baby move more. You should do a fetal movement count: °· When your baby is normally most active. °· At the same time each day. ° °A good time to count movements is while you are resting, after having something to eat and drink. °How do I count fetal movements? °1. Find a quiet, comfortable area. Sit, or lie down on your side. °2. Write down the date, the start time and stop time, and the number of movements that you felt between those two times. Take this information with you to your health care visits. °3. For 2 hours, count kicks, flutters, swishes, rolls, and jabs. You should feel at least 10 movements during 2 hours. °4. You may stop counting after you have felt 10 movements. °5. If you do not feel 10 movements in 2 hours, have something to eat and drink. Then, keep resting and counting for 1 hour. If you feel at least 4 movements during that hour, you may stop counting. °Contact a health care  provider if: °· You feel fewer than 4 movements in 2 hours. °· Your baby is not moving like he or she usually does. °Date: ____________ Start time: ____________ Stop time: ____________ Movements: ____________ °Date: ____________ Start time: ____________ Stop time: ____________ Movements: ____________ °Date: ____________ Start time: ____________ Stop time: ____________ Movements: ____________ °Date: ____________ Start time: ____________ Stop time: ____________ Movements: ____________ °Date: ____________ Start time: ____________ Stop time: ____________ Movements: ____________ °Date: ____________ Start time: ____________ Stop time: ____________ Movements: ____________ °Date: ____________ Start time: ____________ Stop time: ____________ Movements: ____________ °Date: ____________ Start time: ____________ Stop time: ____________ Movements: ____________ °Date: ____________ Start time: ____________ Stop time: ____________ Movements: ____________ °This information is not intended to replace advice given to you by your health care provider. Make sure you discuss any questions you have with your health care provider. °Document Released: 07/31/2006 Document Revised: 02/28/2016 Document Reviewed: 08/10/2015 °Elsevier Interactive Patient Education © 2018 Elsevier Inc. ° °

## 2018-01-06 NOTE — Progress Notes (Signed)
Pt is here for an ROB visit. Poss yeast infection due to antibiotics for her tooth. Also c/o decreased fetal movement.

## 2018-01-06 NOTE — Progress Notes (Addendum)
ROB, doing well . She complains of yeast infection . States she has been on antibiotics due to having tooth pulled. She states she has not feel baby move much over the last 2-3 days. Discussed normal fetal movement. NST today for Decreased fetal movement.   Baseline:140 Variability: moderate Accelerations: present Decelerations: absent Contractions: none  Reactive NST.   Doreene BurkeAnnie Anora Schwenke, CNM

## 2018-01-13 ENCOUNTER — Ambulatory Visit (INDEPENDENT_AMBULATORY_CARE_PROVIDER_SITE_OTHER): Payer: Medicaid Other | Admitting: Certified Nurse Midwife

## 2018-01-13 VITALS — BP 122/81 | HR 101 | Wt 161.1 lb

## 2018-01-13 DIAGNOSIS — Z3483 Encounter for supervision of other normal pregnancy, third trimester: Secondary | ICD-10-CM | POA: Diagnosis not present

## 2018-01-13 LAB — POCT URINALYSIS DIPSTICK
Bilirubin, UA: NEGATIVE
Blood, UA: NEGATIVE
Glucose, UA: NEGATIVE
KETONES UA: NEGATIVE
Leukocytes, UA: NEGATIVE
NITRITE UA: NEGATIVE
PH UA: 7 (ref 5.0–8.0)
PROTEIN UA: POSITIVE — AB
Spec Grav, UA: 1.01 (ref 1.010–1.025)
UROBILINOGEN UA: 0.2 U/dL

## 2018-01-13 NOTE — Patient Instructions (Signed)
Vaginal Delivery Vaginal delivery means that you will give birth by pushing your baby out of your birth canal (vagina). A team of health care providers will help you before, during, and after vaginal delivery. Birth experiences are unique for every woman and every pregnancy, and birth experiences vary depending on where you choose to give birth. What should I do to prepare for my baby's birth? Before your baby is born, it is important to talk with your health care provider about:  Your labor and delivery preferences. These may include: ? Medicines that you may be given. ? How you will manage your pain. This might include non-medical pain relief techniques or injectable pain relief such as epidural analgesia. ? How you and your baby will be monitored during labor and delivery. ? Who may be in the labor and delivery room with you. ? Your feelings about surgical delivery of your baby (cesarean delivery, or C-section) if this becomes necessary. ? Your feelings about receiving donated blood through an IV tube (blood transfusion) if this becomes necessary.  Whether you are able: ? To take pictures or videos of the birth. ? To eat during labor and delivery. ? To move around, walk, or change positions during labor and delivery.  What to expect after your baby is born, such as: ? Whether delayed umbilical cord clamping and cutting is offered. ? Who will care for your baby right after birth. ? Medicines or tests that may be recommended for your baby. ? Whether breastfeeding is supported in your hospital or birth center. ? How long you will be in the hospital or birth center.  How any medical conditions you have may affect your baby or your labor and delivery experience.  To prepare for your baby's birth, you should also:  Attend all of your health care visits before delivery (prenatal visits) as recommended by your health care provider. This is important.  Prepare your home for your baby's  arrival. Make sure that you have: ? Diapers. ? Baby clothing. ? Feeding equipment. ? Safe sleeping arrangements for you and your baby.  Install a car seat in your vehicle. Have your car seat checked by a certified car seat installer to make sure that it is installed safely.  Think about who will help you with your new baby at home for at least the first several weeks after delivery.  What can I expect when I arrive at the birth center or hospital? Once you are in labor and have been admitted into the hospital or birth center, your health care provider may:  Review your pregnancy history and any concerns you have.  Insert an IV tube into one of your veins. This is used to give you fluids and medicines.  Check your blood pressure, pulse, temperature, and heart rate (vital signs).  Check whether your bag of water (amniotic sac) has broken (ruptured).  Talk with you about your birth plan and discuss pain control options.  Monitoring Your health care provider may monitor your contractions (uterine monitoring) and your baby's heart rate (fetal monitoring). You may need to be monitored:  Often, but not continuously (intermittently).  All the time or for long periods at a time (continuously). Continuous monitoring may be needed if: ? You are taking certain medicines, such as medicine to relieve pain or make your contractions stronger. ? You have pregnancy or labor complications.  Monitoring may be done by:  Placing a special stethoscope or a handheld monitoring device on your abdomen to   check your baby's heartbeat, and feeling your abdomen for contractions. This method of monitoring does not continuously record your baby's heartbeat or your contractions.  Placing monitors on your abdomen (external monitors) to record your baby's heartbeat and the frequency and length of contractions. You may not have to wear external monitors all the time.  Placing monitors inside of your uterus  (internal monitors) to record your baby's heartbeat and the frequency, length, and strength of your contractions. ? Your health care provider may use internal monitors if he or she needs more information about the strength of your contractions or your baby's heart rate. ? Internal monitors are put in place by passing a thin, flexible wire through your vagina and into your uterus. Depending on the type of monitor, it may remain in your uterus or on your baby's head until birth. ? Your health care provider will discuss the benefits and risks of internal monitoring with you and will ask for your permission before inserting the monitors.  Telemetry. This is a type of continuous monitoring that can be done with external or internal monitors. Instead of having to stay in bed, you are able to move around during telemetry. Ask your health care provider if telemetry is an option for you.  Physical exam Your health care provider may perform a physical exam. This may include:  Checking whether your baby is positioned: ? With the head toward your vagina (head-down). This is most common. ? With the head toward the top of your uterus (head-up or breech). If your baby is in a breech position, your health care provider may try to turn your baby to a head-down position so you can deliver vaginally. If it does not seem that your baby can be born vaginally, your provider may recommend surgery to deliver your baby. In rare cases, you may be able to deliver vaginally if your baby is head-up (breech delivery). ? Lying sideways (transverse). Babies that are lying sideways cannot be delivered vaginally.  Checking your cervix to determine: ? Whether it is thinning out (effacing). ? Whether it is opening up (dilating). ? How low your baby has moved into your birth canal.  What are the three stages of labor and delivery?  Normal labor and delivery is divided into the following three stages: Stage 1  Stage 1 is the  longest stage of labor, and it can last for hours or days. Stage 1 includes: ? Early labor. This is when contractions may be irregular, or regular and mild. Generally, early labor contractions are more than 10 minutes apart. ? Active labor. This is when contractions get longer, more regular, more frequent, and more intense. ? The transition phase. This is when contractions happen very close together, are very intense, and may last longer than during any other part of labor.  Contractions generally feel mild, infrequent, and irregular at first. They get stronger, more frequent (about every 2-3 minutes), and more regular as you progress from early labor through active labor and transition.  Many women progress through stage 1 naturally, but you may need help to continue making progress. If this happens, your health care provider may talk with you about: ? Rupturing your amniotic sac if it has not ruptured yet. ? Giving you medicine to help make your contractions stronger and more frequent.  Stage 1 ends when your cervix is completely dilated to 4 inches (10 cm) and completely effaced. This happens at the end of the transition phase. Stage 2  Once   your cervix is completely effaced and dilated to 4 inches (10 cm), you may start to feel an urge to push. It is common for the body to naturally take a rest before feeling the urge to push, especially if you received an epidural or certain other pain medicines. This rest period may last for up to 1-2 hours, depending on your unique labor experience.  During stage 2, contractions are generally less painful, because pushing helps relieve contraction pain. Instead of contraction pain, you may feel stretching and burning pain, especially when the widest part of your baby's head passes through the vaginal opening (crowning).  Your health care provider will closely monitor your pushing progress and your baby's progress through the vagina during stage 2.  Your  health care provider may massage the area of skin between your vaginal opening and anus (perineum) or apply warm compresses to your perineum. This helps it stretch as the baby's head starts to crown, which can help prevent perineal tearing. ? In some cases, an incision may be made in your perineum (episiotomy) to allow the baby to pass through the vaginal opening. An episiotomy helps to make the opening of the vagina larger to allow more room for the baby to fit through.  It is very important to breathe and focus so your health care provider can control the delivery of your baby's head. Your health care provider may have you decrease the intensity of your pushing, to help prevent perineal tearing.  After delivery of your baby's head, the shoulders and the rest of the body generally deliver very quickly and without difficulty.  Once your baby is delivered, the umbilical cord may be cut right away, or this may be delayed for 1-2 minutes, depending on your baby's health. This may vary among health care providers, hospitals, and birth centers.  If you and your baby are healthy enough, your baby may be placed on your chest or abdomen to help maintain the baby's temperature and to help you bond with each other. Some mothers and babies start breastfeeding at this time. Your health care team will dry your baby and help keep your baby warm during this time.  Your baby may need immediate care if he or she: ? Showed signs of distress during labor. ? Has a medical condition. ? Was born too early (prematurely). ? Had a bowel movement before birth (meconium). ? Shows signs of difficulty transitioning from being inside the uterus to being outside of the uterus. If you are planning to breastfeed, your health care team will help you begin a feeding. Stage 3  The third stage of labor starts immediately after the birth of your baby and ends after you deliver the placenta. The placenta is an organ that develops  during pregnancy to provide oxygen and nutrients to your baby in the womb.  Delivering the placenta may require some pushing, and you may have mild contractions. Breastfeeding can stimulate contractions to help you deliver the placenta.  After the placenta is delivered, your uterus should tighten (contract) and become firm. This helps to stop bleeding in your uterus. To help your uterus contract and to control bleeding, your health care provider may: ? Give you medicine by injection, through an IV tube, by mouth, or through your rectum (rectally). ? Massage your abdomen or perform a vaginal exam to remove any blood clots that are left in your uterus. ? Empty your bladder by placing a thin, flexible tube (catheter) into your bladder. ? Encourage   you to breastfeed your baby. After labor is over, you and your baby will be monitored closely to ensure that you are both healthy until you are ready to go home. Your health care team will teach you how to care for yourself and your baby. This information is not intended to replace advice given to you by your health care provider. Make sure you discuss any questions you have with your health care provider. Document Released: 04/09/2008 Document Revised: 01/19/2016 Document Reviewed: 07/16/2015 Elsevier Interactive Patient Education  2018 Elsevier Inc.  

## 2018-01-13 NOTE — Progress Notes (Signed)
Pt is here for an ROB visit. Would like to be checked. 

## 2018-01-13 NOTE — Progress Notes (Signed)
ROB-Doing well, no questions or concerns. Requests SVE. Discussed herbal prep. Reviewed red flag symptoms and when to call. RTC x 1 week for ROB or sooner if needed.

## 2018-01-18 ENCOUNTER — Encounter: Payer: Self-pay | Admitting: Certified Nurse Midwife

## 2018-01-20 ENCOUNTER — Ambulatory Visit (INDEPENDENT_AMBULATORY_CARE_PROVIDER_SITE_OTHER): Payer: Medicaid Other | Admitting: Certified Nurse Midwife

## 2018-01-20 ENCOUNTER — Encounter: Payer: Self-pay | Admitting: Certified Nurse Midwife

## 2018-01-20 VITALS — BP 124/72 | HR 85 | Wt 165.2 lb

## 2018-01-20 DIAGNOSIS — Z3483 Encounter for supervision of other normal pregnancy, third trimester: Secondary | ICD-10-CM | POA: Diagnosis not present

## 2018-01-20 LAB — POCT URINALYSIS DIPSTICK
Bilirubin, UA: NEGATIVE
GLUCOSE UA: NEGATIVE
Ketones, UA: NEGATIVE
LEUKOCYTES UA: NEGATIVE
Nitrite, UA: NEGATIVE
Protein, UA: NEGATIVE
RBC UA: NEGATIVE
Spec Grav, UA: 1.01 (ref 1.010–1.025)
Urobilinogen, UA: 0.2 E.U./dL
pH, UA: 7.5 (ref 5.0–8.0)

## 2018-01-20 NOTE — Progress Notes (Signed)
ROB,doing well, good fetal movement. Request SVE today. Anticipatory guidance given regarding induction of labor at 41 weeks and options for positions during labor,plans nitrous oxide and epidural. Patient is using primrose oil and plans to start black cohosh.Labor precautions and red flag symptoms reviewed and when to call. ROB 1 week or as needed.  Naydene Kamrowski,CNM/Shanika Creacy,SNM

## 2018-01-20 NOTE — Patient Instructions (Signed)
Braxton Hicks Contractions °Contractions of the uterus can occur throughout pregnancy, but they are not always a sign that you are in labor. You may have practice contractions called Braxton Hicks contractions. These false labor contractions are sometimes confused with true labor. °What are Braxton Hicks contractions? °Braxton Hicks contractions are tightening movements that occur in the muscles of the uterus before labor. Unlike true labor contractions, these contractions do not result in opening (dilation) and thinning of the cervix. Toward the end of pregnancy (32-34 weeks), Braxton Hicks contractions can happen more often and may become stronger. These contractions are sometimes difficult to tell apart from true labor because they can be very uncomfortable. You should not feel embarrassed if you go to the hospital with false labor. °Sometimes, the only way to tell if you are in true labor is for your health care provider to look for changes in the cervix. The health care provider will do a physical exam and may monitor your contractions. If you are not in true labor, the exam should show that your cervix is not dilating and your water has not broken. °If there are other health problems associated with your pregnancy, it is completely safe for you to be sent home with false labor. You may continue to have Braxton Hicks contractions until you go into true labor. °How to tell the difference between true labor and false labor °True labor °· Contractions last 30-70 seconds. °· Contractions become very regular. °· Discomfort is usually felt in the top of the uterus, and it spreads to the lower abdomen and low back. °· Contractions do not go away with walking. °· Contractions usually become more intense and increase in frequency. °· The cervix dilates and gets thinner. °False labor °· Contractions are usually shorter and not as strong as true labor contractions. °· Contractions are usually irregular. °· Contractions  are often felt in the front of the lower abdomen and in the groin. °· Contractions may go away when you walk around or change positions while lying down. °· Contractions get weaker and are shorter-lasting as time goes on. °· The cervix usually does not dilate or become thin. °Follow these instructions at home: °· Take over-the-counter and prescription medicines only as told by your health care provider. °· Keep up with your usual exercises and follow other instructions from your health care provider. °· Eat and drink lightly if you think you are going into labor. °· If Braxton Hicks contractions are making you uncomfortable: °? Change your position from lying down or resting to walking, or change from walking to resting. °? Sit and rest in a tub of warm water. °? Drink enough fluid to keep your urine pale yellow. Dehydration may cause these contractions. °? Do slow and deep breathing several times an hour. °· Keep all follow-up prenatal visits as told by your health care provider. This is important. °Contact a health care provider if: °· You have a fever. °· You have continuous pain in your abdomen. °Get help right away if: °· Your contractions become stronger, more regular, and closer together. °· You have fluid leaking or gushing from your vagina. °· You pass blood-tinged mucus (bloody show). °· You have bleeding from your vagina. °· You have low back pain that you never had before. °· You feel your baby’s head pushing down and causing pelvic pressure. °· Your baby is not moving inside you as much as it used to. °Summary °· Contractions that occur before labor are called Braxton   Hicks contractions, false labor, or practice contractions. °· Braxton Hicks contractions are usually shorter, weaker, farther apart, and less regular than true labor contractions. True labor contractions usually become progressively stronger and regular and they become more frequent. °· Manage discomfort from Braxton Hicks contractions by  changing position, resting in a warm bath, drinking plenty of water, or practicing deep breathing. °This information is not intended to replace advice given to you by your health care provider. Make sure you discuss any questions you have with your health care provider. °Document Released: 11/14/2016 Document Revised: 11/14/2016 Document Reviewed: 11/14/2016 °Elsevier Interactive Patient Education © 2018 Elsevier Inc. ° °

## 2018-01-20 NOTE — Progress Notes (Signed)
Pt is here for an ROB visit. Would like to be checked. 

## 2018-01-25 ENCOUNTER — Emergency Department
Admission: EM | Admit: 2018-01-25 | Discharge: 2018-01-25 | Disposition: A | Discharge status: Home / Self Care | Payer: Medicaid Other | Attending: Emergency Medicine | Admitting: Emergency Medicine

## 2018-01-25 ENCOUNTER — Other Ambulatory Visit: Payer: Self-pay

## 2018-01-25 DIAGNOSIS — Z87891 Personal history of nicotine dependence: Secondary | ICD-10-CM | POA: Insufficient documentation

## 2018-01-25 DIAGNOSIS — X509XXA Other and unspecified overexertion or strenuous movements or postures, initial encounter: Secondary | ICD-10-CM | POA: Diagnosis not present

## 2018-01-25 DIAGNOSIS — Z79899 Other long term (current) drug therapy: Secondary | ICD-10-CM | POA: Diagnosis not present

## 2018-01-25 DIAGNOSIS — Y929 Unspecified place or not applicable: Secondary | ICD-10-CM | POA: Insufficient documentation

## 2018-01-25 DIAGNOSIS — M25512 Pain in left shoulder: Secondary | ICD-10-CM | POA: Diagnosis not present

## 2018-01-25 DIAGNOSIS — Y939 Activity, unspecified: Secondary | ICD-10-CM | POA: Insufficient documentation

## 2018-01-25 DIAGNOSIS — Y999 Unspecified external cause status: Secondary | ICD-10-CM | POA: Diagnosis not present

## 2018-01-25 DIAGNOSIS — S4992XA Unspecified injury of left shoulder and upper arm, initial encounter: Secondary | ICD-10-CM | POA: Diagnosis present

## 2018-01-25 LAB — COMPREHENSIVE METABOLIC PANEL
ALBUMIN: 2.9 g/dL — AB (ref 3.5–5.0)
ALT: 12 U/L (ref 0–44)
AST: 19 U/L (ref 15–41)
Alkaline Phosphatase: 153 U/L — ABNORMAL HIGH (ref 38–126)
Anion gap: 7 (ref 5–15)
BILIRUBIN TOTAL: 0.6 mg/dL (ref 0.3–1.2)
BUN: 9 mg/dL (ref 6–20)
CO2: 23 mmol/L (ref 22–32)
CREATININE: 0.55 mg/dL (ref 0.44–1.00)
Calcium: 9.3 mg/dL (ref 8.9–10.3)
Chloride: 107 mmol/L (ref 98–111)
GFR calc Af Amer: >60 mL/min (ref >60)
Glucose, Bld: 87 mg/dL (ref 70–99)
POTASSIUM: 4.1 mmol/L (ref 3.5–5.1)
Sodium: 137 mmol/L (ref 135–145)
TOTAL PROTEIN: 6.3 g/dL — AB (ref 6.5–8.1)

## 2018-01-25 LAB — CBC WITH DIFFERENTIAL/PLATELET
BASOS ABS: 0 10*3/uL (ref 0–0.1)
BASOS PCT: 1 %
Eosinophils Absolute: 0.2 10*3/uL (ref 0–0.7)
Eosinophils Relative: 2 %
HEMATOCRIT: 34.2 % — AB (ref 35.0–47.0)
HEMOGLOBIN: 11.9 g/dL — AB (ref 12.0–16.0)
LYMPHS PCT: 23 %
Lymphs Abs: 2.2 10*3/uL (ref 1.0–3.6)
MCH: 32.6 pg (ref 26.0–34.0)
MCHC: 34.8 g/dL (ref 32.0–36.0)
MCV: 93.6 fL (ref 80.0–100.0)
MONO ABS: 0.8 10*3/uL (ref 0.2–0.9)
MONOS PCT: 9 %
NEUTROS PCT: 65 %
Neutro Abs: 6 10*3/uL (ref 1.4–6.5)
Platelets: 128 10*3/uL — ABNORMAL LOW (ref 150–440)
RBC: 3.65 MIL/uL — ABNORMAL LOW (ref 3.80–5.20)
RDW: 15.3 % — ABNORMAL HIGH (ref 11.5–14.5)
WBC: 9.3 10*3/uL (ref 3.6–11.0)

## 2018-01-25 LAB — URINE DRUG SCREEN, QUALITATIVE (ARMC ONLY)
Amphetamines, Ur Screen: NOT DETECTED
BENZODIAZEPINE, UR SCRN: NOT DETECTED
CANNABINOID 50 NG, UR ~~LOC~~: POSITIVE — AB
Cocaine Metabolite,Ur ~~LOC~~: NOT DETECTED
MDMA (ECSTASY) UR SCREEN: NOT DETECTED
Methadone Scn, Ur: NOT DETECTED
OPIATE, UR SCREEN: NOT DETECTED
PHENCYCLIDINE (PCP) UR S: NOT DETECTED
Tricyclic, Ur Screen: NOT DETECTED

## 2018-01-25 LAB — SALICYLATE LEVEL: Salicylate Lvl: <7 mg/dL (ref 2.8–30.0)

## 2018-01-25 LAB — ETHANOL: Alcohol, Ethyl (B): <10 mg/dL (ref <10)

## 2018-01-25 LAB — URINALYSIS, COMPLETE (UACMP) WITH MICROSCOPIC
Bacteria, UA: NONE SEEN
Bilirubin Urine: NEGATIVE
Glucose, UA: NEGATIVE mg/dL
HGB URINE DIPSTICK: NEGATIVE
KETONES UR: NEGATIVE mg/dL
Leukocytes, UA: NEGATIVE
NITRITE: NEGATIVE
PH: 6 (ref 5.0–8.0)
PROTEIN: NEGATIVE mg/dL
Specific Gravity, Urine: 1.012 (ref 1.005–1.030)

## 2018-01-25 LAB — ACETAMINOPHEN LEVEL: ACETAMINOPHEN (TYLENOL), SERUM: 25 ug/mL (ref 10–30)

## 2018-01-25 LAB — PROTIME-INR
INR: 0.88
PROTHROMBIN TIME: 11.9 s (ref 11.4–15.2)

## 2018-01-25 NOTE — ED Notes (Signed)
See triage note  Presents with some left shoulder pain which started about 3 days ago  Pain increases with lying down  Denies any injury  No SOB or cough/fever no deformity noted pt is 39 weeks preg

## 2018-01-25 NOTE — ED Notes (Signed)
States her pain is returning to posterior shoulder  Ice applied to area

## 2018-01-25 NOTE — ED Notes (Signed)
Patient waiting patiently in [facility] for treatment room

## 2018-01-25 NOTE — ED Triage Notes (Signed)
Reports left shoulder pain for 3 days, reports pain worse with laying down, better with sitting up.  Pain into shoulder blade, left upper chest (clavicle region) and down left arm.  Denies any known injury.

## 2018-01-25 NOTE — ED Provider Notes (Signed)
[facility] Emergency Department Provider Note  ____________________________________________   First MD Initiated Contact with Patient 01/25/18 [id number]     (approximate)  I have reviewed the triage vital signs and the nursing notes.   HISTORY  Chief Complaint Shoulder Pain  HPI Vanessa Gallagher is a 27 y.o. female is here with complaint of left shoulder pain.  Patient reports that she began having shoulder pain approximately 3 days ago which is worse with lying down and better with sitting up.  She denies any recent or past injury to her left shoulder.  Range of motion increases her pain.  Patient states that she took Tylenol frequently last night.  Initially she took one Tylenol and waited 30 minutes and repeated another Tylenol after this did not work.  She then began taking Tylenol extra strength 2 tablets every 2-3 hours while she was awake.  She denies any chest pain, shortness of breath, nausea or vomiting.  There is been no diaphoresis.  She denies any abdominal pain, vaginal discharge or vaginal pain, no urinary symptoms, and no low back pain.  [facility] her [facility] care.  Currently she rates her pain as 4 out of 10.  No past medical history on file.  Patient Active Problem List   Diagnosis Date Noted  . Anemia affecting pregnancy 12/10/2017  . 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 Late prenatal care 08/25/2017  . Second trimester pregnancy 08/25/2017    No past surgical history on file.  Prior to Admission medications   Medication Sig Start Date End Date Taking? Authorizing Provider  doxylamine, Sleep, (UNISOM) 25 MG tablet     [provider]  Prenatal-DSS-FeCb-FeGl-FA (CITRANATAL BLOOM) 90-1 MG TABS Take 1 tablet by mouth daily. 11/03/17   Shambley, Melody N, CNM  ranitidine (ZANTAC) 150 MG tablet Take 1 tablet (150 mg total) by mouth 2 (two) times daily. 11/25/17   Purcell NailsShambley, Melody N, CNM    Allergies Patient  has no known allergies.  No family history on file.  Social History Social History   Tobacco Use  . Smoking status: Former Games developermoker  . Smokeless tobacco: Never Used  Substance Use Topics  . Alcohol use: Yes    Alcohol/week: 3.6 oz    Types: 6 Cans of beer per week  . Drug use: No    Review of Systems Constitutional: No fever/chills Eyes: No visual changes. ENT: No sore throat. Cardiovascular: Denies chest pain. Respiratory: Denies shortness of breath.  Negative for difficulty breathing or wheezing. Gastrointestinal: No abdominal pain.  No nausea, no vomiting.  No diarrhea.  No constipation. Genitourinary: Negative for dysuria. Musculoskeletal: Negative for back pain.  Positive for left shoulder pain. Skin: Negative for rash. Neurological: Negative for headaches, focal weakness or numbness. ____________________________________________   PHYSICAL EXAM:  VITAL SIGNS: ED Triage Vitals [01/25/18 0604]  Enc Vitals Group     BP 139/90     Pulse Rate 91     Resp 18     Temp 97.7 F (36.5 C)     Temp src      SpO2 100 %     Weight      Height 5\' 4"  (1.626 m)     Head Circumference      Peak Flow      Pain Score 4     Pain Loc      Pain Edu?      Excl. in GC?    Constitutional: Alert  and oriented. Well appearing and in no acute distress. Eyes: Conjunctivae are normal.  Head: Atraumatic. Nose: No congestion/rhinnorhea. Neck: No stridor.   Cardiovascular: Normal rate, regular rhythm. Grossly normal heart sounds.  Good peripheral circulation. Respiratory: Normal respiratory effort.  No retractions. Lungs CTAB. Gastrointestinal: Soft and nontender.  Patient with very pregnant abdomen.  No CVS tenderness noted. Musculoskeletal: On examination of the left shoulder there is no gross deformity or soft tissue swelling present.  Range of motion reproduces pain.  There is some tenderness with palpation of the left scapula and soft tissue surrounding including the rhomboid muscle  and trapezius muscle.  There is no point tenderness on palpation of the left clavicle or AC joint.  No crepitus is noted with range of motion.  No discoloration noted.  Pulses present.  Motor sensory function intact.  Nontender thoracic spine to palpation posteriorly. Neurologic:  Normal speech and language. No gross focal neurologic deficits are appreciated.  Skin:  Skin is warm, dry and intact. No rash noted. Psychiatric: Mood and affect are normal. Speech and behavior are normal.  ____________________________________________   LABS (all labs ordered are listed, but only abnormal results are displayed)  Labs Reviewed  CBC WITH DIFFERENTIAL/PLATELET - Abnormal; Notable for the following components:      Result Value   RBC 3.65 (*)    Hemoglobin 11.9 (*)    HCT 34.2 (*)    RDW 15.3 (*)    Platelets 128 (*)    All other components within normal limits  COMPREHENSIVE METABOLIC PANEL - Abnormal; Notable for the following components:   Total Protein 6.3 (*)    Albumin 2.9 (*)    Alkaline Phosphatase 153 (*)    All other components within normal limits  URINALYSIS, COMPLETE (UACMP) WITH MICROSCOPIC - Abnormal; Notable for the following components:   Color, Urine YELLOW (*)    APPearance CLEAR (*)    All other components within normal limits  URINE DRUG SCREEN, QUALITATIVE ([facility] ONLY) - Abnormal; Notable for the following components:   Cannabinoid 50 Ng, Ur [place] POSITIVE (*)    Barbiturates, Ur Screen   (*)    Value: Result not available. Reagent lot number recalled by manufacturer.   All other components within normal limits  ACETAMINOPHEN LEVEL  SALICYLATE LEVEL  ETHANOL  PROTIME-INR    PROCEDURES  Procedure(s) performed: None  Procedures  Critical Care performed: No  ____________________________________________   INITIAL IMPRESSION / ASSESSMENT AND PLAN / ED COURSE  As part of my medical decision making, I reviewed the following data within the electronic medical  record:  Notes from prior ED visits and [place] Controlled Substance Database   ----------------------------------------- 11:43 AM on 01/25/2018 ----------------------------------------- Lab work was reassuring that patient has acetaminophen and salicylate levels that are nontoxic.  Urine drug screen is positive for cannabis.  Negative for alcohol.  Urinalysis is negative.  Patient is aware that she is slightly anemic and has iron tablets at home for this.  Fetal heart tones were 142.  Dr. Tiburcio Pea is on-call for [facility].  I talked with him by telephone.  He is agreeable with a conservative plan of patient using ice packs to her shoulder and limited amounts of Tylenol.  Patient is noted to be comfortable in the ED at present with only an ice pack on her shoulder.  She was monitored over the course of her stay in the ED wall waiting for lab test results.  There was no worsening of her pain.  There continue to be no abdominal, chest pain, vaginal pain reported.  She states that she has an appointment at the clinic on Tuesday.  Patient was ambulatory at the time of discharge without any difficulties.  She is return to the emergency department if any severe worsening of her symptoms or urgent concerns.     ____________________________________________   FINAL CLINICAL IMPRESSION(S) / ED DIAGNOSES  Final diagnoses:  Acute pain of left shoulder     ED Discharge Orders    None       Note:  This document was prepared using Dragon voice recognition software and may include unintentional dictation errors.    Tommi Rumps, PA-C 01/25/18 1818    Jeanmarie Plant, MD 01/30/18 [phone]

## 2018-01-25 NOTE — Discharge Instructions (Addendum)
Keep your appointment with [facility] on Tuesday. Use ice to your shoulder as needed for discomfort.  You may also alternate between heat and ice to see which gives you more relief.  Take Tylenol sparingly.  Limit your Tylenol intake to 1 or 2 tablets every 4-6 hours if needed for pain.

## 2018-01-25 NOTE — ED Notes (Signed)
Resting at present  Family at bedside   

## 2018-01-27 ENCOUNTER — Other Ambulatory Visit: Payer: Self-pay | Admitting: Certified Nurse Midwife

## 2018-01-27 ENCOUNTER — Telehealth: Payer: Self-pay

## 2018-01-27 ENCOUNTER — Ambulatory Visit (INDEPENDENT_AMBULATORY_CARE_PROVIDER_SITE_OTHER): Payer: Medicaid Other | Admitting: Certified Nurse Midwife

## 2018-01-27 ENCOUNTER — Other Ambulatory Visit: Payer: Medicaid Other

## 2018-01-27 ENCOUNTER — Telehealth: Payer: Self-pay | Admitting: Certified Nurse Midwife

## 2018-01-27 VITALS — BP 115/84 | HR 107 | Wt 166.6 lb

## 2018-01-27 DIAGNOSIS — O480 Post-term pregnancy: Secondary | ICD-10-CM | POA: Diagnosis not present

## 2018-01-27 DIAGNOSIS — Z3A40 40 weeks gestation of pregnancy: Secondary | ICD-10-CM | POA: Diagnosis not present

## 2018-01-27 LAB — POCT URINALYSIS DIPSTICK
BILIRUBIN UA: NEGATIVE
Blood, UA: NEGATIVE
GLUCOSE UA: NEGATIVE
KETONES UA: NEGATIVE
Leukocytes, UA: NEGATIVE
Nitrite, UA: NEGATIVE
Protein, UA: POSITIVE — AB
SPEC GRAV UA: 1.02 (ref 1.010–1.025)
Urobilinogen, UA: 0.2 E.U./dL
pH, UA: 7 (ref 5.0–8.0)

## 2018-01-27 MED ORDER — CYCLOBENZAPRINE HCL 10 MG PO TABS: 10.0000 mg | ORAL_TABLET | Freq: Three times a day (TID) | ORAL | 2 refills | Status: DC | PRN

## 2018-01-27 NOTE — Addendum Note (Signed)
Addended by: Brooke DareSICK, Sahaj Bona L on: 01/27/2018 10:53 AM   Modules accepted: Orders

## 2018-01-27 NOTE — Progress Notes (Signed)
ROB-Reports left shoulder pain. [facility], no relief with home treatment measures. Rx: Flexeril, see orders. NST performed today was reviewed and was found to be reactive. Baseline 140 bpm with moderate variability, acceleration present, and on decelerations noted. Discussed postdates care and IOL scheduled for Tuesday, 02/03/2018 at 0500.  Continue recommended antenatal testing and prenatal care. Reviewed red flag symptoms and when to call. RTC x Friday for Growth US/BPP and ROB with Pattricia BossAnnie

## 2018-01-27 NOTE — Patient Instructions (Signed)
Cyclobenzaprine tablets What is this medicine? CYCLOBENZAPRINE (sye kloe BEN za preen) is a muscle relaxer. It is used to treat muscle pain, spasms, and stiffness. This medicine may be used for other purposes; ask your health care provider or pharmacist if you have questions. COMMON BRAND NAME(S): Fexmid, Flexeril What should I tell my health care provider before I take this medicine? They need to know if you have any of these conditions: -heart disease, irregular heartbeat, or previous heart attack -liver disease -thyroid problem -an unusual or allergic reaction to cyclobenzaprine, tricyclic antidepressants, lactose, other medicines, foods, dyes, or preservatives -pregnant or trying to get pregnant -breast-feeding How should I use this medicine? Take this medicine by mouth with a glass of water. Follow the directions on the prescription label. If this medicine upsets your stomach, take it with food or milk. Take your medicine at regular intervals. Do not take it more often than directed. Talk to your pediatrician regarding the use of this medicine in children. Special care may be needed. Overdosage: If you think you have taken too much of this medicine contact a poison control center or emergency room at once. NOTE: This medicine is only for you. Do not share this medicine with others. What if I miss a dose? If you miss a dose, take it as soon as you can. If it is almost time for your next dose, take only that dose. Do not take double or extra doses. What may interact with this medicine? Do not take this medicine with any of the following medications: -certain medicines for fungal infections like fluconazole, itraconazole, ketoconazole, posaconazole, voriconazole -cisapride -dofetilide -dronedarone -halofantrine -levomethadyl -MAOIs like Carbex, Eldepryl, Marplan, Nardil, and Parnate -narcotic medicines for cough -pimozide -thioridazine -ziprasidone This medicine may also interact  with the following medications: -alcohol -antihistamines for allergy, cough and cold -certain medicines for anxiety or sleep -certain medicines for cancer -certain medicines for depression like amitriptyline, fluoxetine, sertraline -certain medicines for infection like alfuzosin, chloroquine, clarithromycin, levofloxacin, mefloquine, pentamidine, troleandomycin -certain medicines for irregular heart beat -certain medicines for seizures like phenobarbital, primidone -contrast dyes -general anesthetics like halothane, isoflurane, methoxyflurane, propofol -local anesthetics like lidocaine, pramoxine, tetracaine -medicines that relax muscles for surgery -narcotic medicines for pain -other medicines that prolong the QT interval (cause an abnormal heart rhythm) -phenothiazines like chlorpromazine, mesoridazine, prochlorperazine This list may not describe all possible interactions. Give your health care provider a list of all the medicines, herbs, non-prescription drugs, or dietary supplements you use. Also tell them if you smoke, drink alcohol, or use illegal drugs. Some items may interact with your medicine. What should I watch for while using this medicine? Tell your doctor or health care professional if your symptoms do not start to get better or if they get worse. You may get drowsy or dizzy. Do not drive, use machinery, or do anything that needs mental alertness until you know how this medicine affects you. Do not stand or sit up quickly, especially if you are an older patient. This reduces the risk of dizzy or fainting spells. Alcohol may interfere with the effect of this medicine. Avoid alcoholic drinks. If you are taking another medicine that also causes drowsiness, you may have more side effects. Give your health care provider a list of all medicines you use. Your doctor will tell you how much medicine to take. Do not take more medicine than directed. Call emergency for help if you have  problems breathing or unusual sleepiness. Your mouth may get dry. Chewing   sugarless gum or sucking hard candy, and drinking plenty of water may help. Contact your doctor if the problem does not go away or is severe. What side effects may I notice from receiving this medicine? Side effects that you should report to your doctor or health care professional as soon as possible: -allergic reactions like skin rash, itching or hives, swelling of the face, lips, or tongue -breathing problems -chest pain -fast, irregular heartbeat -hallucinations -seizures -unusually weak or tired Side effects that usually do not require medical attention (report to your doctor or health care professional if they continue or are bothersome): -headache -nausea, vomiting This list may not describe all possible side effects. Call your doctor for medical advice about side effects. You may report side effects to FDA at 1-800-FDA-1088. Where should I keep my medicine? Keep out of the reach of children. Store at room temperature between 15 and 30 degrees C (59 and 86 degrees F). Keep container tightly closed. Throw away any unused medicine after the expiration date. NOTE: This sheet is a summary. It may not cover all possible information. If you have questions about this medicine, talk to your doctor, pharmacist, or health care provider.  2018 Elsevier/Gold Standard (2015-04-11 12:05:46) Fetal Movement Counts Patient Name: ________________________________________________ Patient Due Date: ____________________ What is a fetal movement count? A fetal movement count is the number of times that you feel your baby move during a certain amount of time. This may also be called a fetal kick count. A fetal movement count is recommended for every pregnant woman. You may be asked to start counting fetal movements as early as week 28 of your pregnancy. Pay attention to when your baby is most active. You may notice your baby's sleep and  wake cycles. You may also notice things that make your baby move more. You should do a fetal movement count:  When your baby is normally most active.  At the same time each day.  A good time to count movements is while you are resting, after having something to eat and drink. How do I count fetal movements? 1. Find a quiet, comfortable area. Sit, or lie down on your side. 2. Write down the date, the start time and stop time, and the number of movements that you felt between those two times. Take this information with you to your health care visits. 3. For 2 hours, count kicks, flutters, swishes, rolls, and jabs. You should feel at least 10 movements during 2 hours. 4. You may stop counting after you have felt 10 movements. 5. If you do not feel 10 movements in 2 hours, have something to eat and drink. Then, keep resting and counting for 1 hour. If you feel at least 4 movements during that hour, you may stop counting. Contact a health care provider if:  You feel fewer than 4 movements in 2 hours.  Your baby is not moving like he or she usually does. Date: ____________ Start time: ____________ Stop time: ____________ Movements: ____________ Date: ____________ Start time: ____________ Stop time: ____________ Movements: ____________ Date: ____________ Start time: ____________ Stop time: ____________ Movements: ____________ Date: ____________ Start time: ____________ Stop time: ____________ Movements: ____________ Date: ____________ Start time: ____________ Stop time: ____________ Movements: ____________ Date: ____________ Start time: ____________ Stop time: ____________ Movements: ____________ Date: ____________ Start time: ____________ Stop time: ____________ Movements: ____________ Date: ____________ Start time: ____________ Stop time: ____________ Movements: ____________ Date: ____________ Start time: ____________ Stop time: ____________ Movements: ____________ This information is not  intended to replace advice given to you by your health care provider. Make sure you discuss any questions you have with your health care provider. Document Released: 07/31/2006 Document Revised: 02/28/2016 Document Reviewed: 08/10/2015 Elsevier Interactive Patient Education  [employer]2018 Elsevier Inc.

## 2018-01-27 NOTE — Telephone Encounter (Signed)
Spoke with pt- she was on her way to pharmacy to pick up script that was sent this AM. Requested she send me a mychart message to report if its helping. Pt agreed.

## 2018-01-27 NOTE — Telephone Encounter (Signed)
The patients husband called and stated that he needs to speak with a nurse or provider in regards to the patient having shoulder pain. No other information was disclosed. Please advise.

## 2018-01-27 NOTE — Progress Notes (Signed)
Pt is here for an ROB visit. Also an NST and BPP on Friday. C/o left shoulder pain.

## 2018-01-29 ENCOUNTER — Telehealth: Payer: Self-pay | Admitting: Certified Nurse Midwife

## 2018-01-29 ENCOUNTER — Encounter: Payer: Self-pay | Admitting: Certified Nurse Midwife

## 2018-01-29 NOTE — Telephone Encounter (Signed)
Please advise, unable to induce at this time. Would recommend ER visit due to pain of this level. Thanks, JML

## 2018-01-29 NOTE — Telephone Encounter (Signed)
Patient called stating she didn't get any sleep last night due to her shoulder pain. She didn't know if there is something else she could do.

## 2018-01-30 ENCOUNTER — Ambulatory Visit (INDEPENDENT_AMBULATORY_CARE_PROVIDER_SITE_OTHER): Payer: Medicaid Other

## 2018-01-30 ENCOUNTER — Ambulatory Visit (INDEPENDENT_AMBULATORY_CARE_PROVIDER_SITE_OTHER): Payer: Medicaid Other | Admitting: Certified Nurse Midwife

## 2018-01-30 VITALS — BP 125/97 | HR 97 | Wt 165.9 lb

## 2018-01-30 DIAGNOSIS — M25511 Pain in right shoulder: Secondary | ICD-10-CM

## 2018-01-30 DIAGNOSIS — Z3A38 38 weeks gestation of pregnancy: Secondary | ICD-10-CM

## 2018-01-30 DIAGNOSIS — O480 Post-term pregnancy: Secondary | ICD-10-CM

## 2018-01-30 DIAGNOSIS — Z3483 Encounter for supervision of other normal pregnancy, third trimester: Secondary | ICD-10-CM

## 2018-01-30 DIAGNOSIS — O9989 Other specified diseases and conditions complicating pregnancy, childbirth and the puerperium: Secondary | ICD-10-CM

## 2018-01-30 LAB — POCT URINALYSIS DIPSTICK
BILIRUBIN UA: NEGATIVE
GLUCOSE UA: NEGATIVE
KETONES UA: NEGATIVE
Leukocytes, UA: NEGATIVE
Nitrite, UA: NEGATIVE
PH UA: 7 (ref 5.0–8.0)
Protein, UA: NEGATIVE
RBC UA: NEGATIVE
Spec Grav, UA: <=1.005 — AB (ref 1.010–1.025)
UROBILINOGEN UA: 0.2 U/dL

## 2018-01-30 MED ORDER — ZOLPIDEM TARTRATE 5 MG PO TABS: 5.0000 mg | ORAL_TABLET | Freq: Every evening | ORAL | 0 refills | Status: DC | PRN

## 2018-01-30 NOTE — Progress Notes (Signed)
ROB and BPP today for post dates. BPP 8/8. AFI borderline 5.3. Dr Valentino Saxoncherry consulted. Plan for kick counts and induction as scheduled on 7/23. She has left sided shoulder pain and has been to ED for evaluation. JML gave muscle relaxer's that are helping during the day. She is having difficult sleeping. Ambien 5 mg given. Pt instructed on risk /beneifits of meds in pregnancy . She verbalizes understanding. Instructed to take sparingly. Follow up as scheduled Tuesday for induction.   Thanks,  Doreene BurkeAnnie Naoma Boxell, CNM    ULTRASOUND REPORT  Location: [facility] Date of Service:  01/30/2018  Indications: BPP & Growth; Post-dates Findings:  Singleton intrauterine pregnancy is visualized with FHR at 150 BPM. Biometrics give an (U/S) Gestational age of 25 1/7 weeks and an (U/S) EDD of 02/12/18; this correlates with the clinically established EDD of 01/26/18.  Fetal presentation is vertex.  EFW: 3318 grams (7lb 5oz).  38th percentile.  BPD measures 74th, HC measures 3rd, AC measures 8th, and FL measures 5th percentile. Placenta: Anterior and grade 3. AFI: 5.3 cm (2 pockets with no fluid, 1 pocket with 1.7 cm, and 1 pocket with 3.55 cm)  BPP: 8/8 with good visualization of fetal movement, tone, breathing, and fluid.  Fetal stomach, kidneys, and bladder appear WNL.  Impression: 1. 38 1/7 week Viable Singleton Intrauterine pregnancy by U/S. 2. (U/S) EDD is consistent with Clinically established (LMP) EDD of 01/26/18. 3. EFW: 3318 grams (7lb 5oz).  38th percentile.  Individual measurements stated above. 4. BPP: 8/8  Recommendations: 1.Clinical correlation with the patient's History and Physical Exam.   Kari BaarsJill Long, RDMS

## 2018-01-30 NOTE — Patient Instructions (Signed)

## 2018-02-03 ENCOUNTER — Other Ambulatory Visit: Payer: Self-pay | Admitting: Certified Nurse Midwife

## 2018-02-04 ENCOUNTER — Inpatient Hospital Stay: Payer: Medicaid Other | Admitting: Anesthesiology

## 2018-02-04 ENCOUNTER — Other Ambulatory Visit: Payer: Self-pay

## 2018-02-04 ENCOUNTER — Inpatient Hospital Stay
Admission: EM | Admit: 2018-02-04 | Discharge: 2018-02-07 | DRG: 807 | Disposition: A | Discharge status: Home / Self Care | Payer: Medicaid Other | Attending: Certified Nurse Midwife | Admitting: Certified Nurse Midwife

## 2018-02-04 DIAGNOSIS — O99334 Smoking (tobacco) complicating childbirth: Secondary | ICD-10-CM | POA: Diagnosis present

## 2018-02-04 DIAGNOSIS — O9902 Anemia complicating childbirth: Secondary | ICD-10-CM | POA: Diagnosis present

## 2018-02-04 DIAGNOSIS — F17210 Nicotine dependence, cigarettes, uncomplicated: Secondary | ICD-10-CM | POA: Diagnosis present

## 2018-02-04 DIAGNOSIS — O480 Post-term pregnancy: Secondary | ICD-10-CM | POA: Diagnosis not present

## 2018-02-04 DIAGNOSIS — Z3483 Encounter for supervision of other normal pregnancy, third trimester: Secondary | ICD-10-CM | POA: Diagnosis present

## 2018-02-04 DIAGNOSIS — D649 Anemia, unspecified: Secondary | ICD-10-CM | POA: Diagnosis present

## 2018-02-04 DIAGNOSIS — Z3A41 41 weeks gestation of pregnancy: Secondary | ICD-10-CM

## 2018-02-04 LAB — CBC
HEMATOCRIT: 38.6 % (ref 35.0–47.0)
HEMOGLOBIN: 13.3 g/dL (ref 12.0–16.0)
MCH: 32.4 pg (ref 26.0–34.0)
MCHC: 34.6 g/dL (ref 32.0–36.0)
MCV: 93.8 fL (ref 80.0–100.0)
Platelets: 159 10*3/uL (ref 150–440)
RBC: 4.12 MIL/uL (ref 3.80–5.20)
RDW: 14.6 % — ABNORMAL HIGH (ref 11.5–14.5)
WBC: 12.9 10*3/uL — ABNORMAL HIGH (ref 3.6–11.0)

## 2018-02-04 LAB — URINE DRUG SCREEN, QUALITATIVE (ARMC ONLY)
AMPHETAMINES, UR SCREEN: NOT DETECTED
Barbiturates, Ur Screen: NOT DETECTED
Benzodiazepine, Ur Scrn: NOT DETECTED
CANNABINOID 50 NG, UR ~~LOC~~: POSITIVE — AB
COCAINE METABOLITE, UR ~~LOC~~: NOT DETECTED
MDMA (ECSTASY) UR SCREEN: NOT DETECTED
METHADONE SCREEN, URINE: NOT DETECTED
Opiate, Ur Screen: NOT DETECTED
Phencyclidine (PCP) Ur S: NOT DETECTED
TRICYCLIC, UR SCREEN: NOT DETECTED

## 2018-02-04 LAB — TYPE AND SCREEN
ABO/RH(D): O POS
ANTIBODY SCREEN: NEGATIVE

## 2018-02-04 MED ORDER — PHENYLEPHRINE 40 MCG/ML (10ML) SYRINGE FOR IV PUSH (FOR BLOOD PRESSURE SUPPORT): 80.0000 ug | PREFILLED_SYRINGE | INTRAVENOUS | Status: DC | PRN

## 2018-02-04 MED ORDER — SOD CITRATE-CITRIC ACID 500-334 MG/5ML PO SOLN: 30.0000 mL | ORAL | Status: DC | PRN

## 2018-02-04 MED ORDER — OXYTOCIN 10 UNIT/ML IJ SOLN
INTRAMUSCULAR | Status: AC
  Filled 2018-02-04: qty 2

## 2018-02-04 MED ORDER — OXYTOCIN BOLUS FROM INFUSION
500.0000 mL | Freq: Once | INTRAVENOUS | Status: AC
  Administered 2018-02-05: 500 mL via INTRAVENOUS

## 2018-02-04 MED ORDER — LIDOCAINE HCL (PF) 1 % IJ SOLN
INTRAMUSCULAR | Status: DC | PRN
  Administered 2018-02-04: 3 mL

## 2018-02-04 MED ORDER — MISOPROSTOL 50MCG HALF TABLET
ORAL_TABLET | ORAL | Status: AC
  Filled 2018-02-04: qty 1

## 2018-02-04 MED ORDER — OXYTOCIN 40 UNITS IN LACTATED RINGERS INFUSION - SIMPLE MED
INTRAVENOUS | Status: AC
  Administered 2018-02-05: 500 mL via INTRAVENOUS
  Filled 2018-02-04: qty 1000

## 2018-02-04 MED ORDER — EPHEDRINE 5 MG/ML INJ: 10.0000 mg | INTRAVENOUS | Status: DC | PRN

## 2018-02-04 MED ORDER — OXYTOCIN 40 UNITS IN LACTATED RINGERS INFUSION - SIMPLE MED
2.5000 [IU]/h | INTRAVENOUS | Status: DC
  Administered 2018-02-05: 39.96 [IU]/h via INTRAVENOUS
  Administered 2018-02-05: 2.5 [IU]/h via INTRAVENOUS

## 2018-02-04 MED ORDER — MISOPROSTOL 50MCG HALF TABLET
50.0000 ug | ORAL_TABLET | ORAL | Status: DC | PRN
  Administered 2018-02-04: 50 ug via BUCCAL
  Administered 2018-02-05: 200 ug via BUCCAL

## 2018-02-04 MED ORDER — LACTATED RINGERS IV SOLN: 500.0000 mL | Freq: Once | INTRAVENOUS | Status: DC

## 2018-02-04 MED ORDER — MISOPROSTOL 200 MCG PO TABS
ORAL_TABLET | ORAL | Status: AC
  Filled 2018-02-04: qty 4

## 2018-02-04 MED ORDER — LIDOCAINE HCL (PF) 1 % IJ SOLN: 30.0000 mL | INTRAMUSCULAR | Status: DC | PRN

## 2018-02-04 MED ORDER — ACETAMINOPHEN 325 MG PO TABS: 650.0000 mg | ORAL_TABLET | ORAL | Status: DC | PRN

## 2018-02-04 MED ORDER — TERBUTALINE SULFATE 1 MG/ML IJ SOLN: 0.2500 mg | Freq: Once | INTRAMUSCULAR | Status: DC | PRN

## 2018-02-04 MED ORDER — TERBUTALINE SULFATE 1 MG/ML IJ SOLN
0.2500 mg | Freq: Once | INTRAMUSCULAR | Status: AC | PRN
  Administered 2018-02-04: 0.25 mg via SUBCUTANEOUS
  Filled 2018-02-04: qty 1

## 2018-02-04 MED ORDER — OXYTOCIN 10 UNIT/ML IJ SOLN: 10.0000 [IU] | Freq: Once | INTRAMUSCULAR | Status: DC

## 2018-02-04 MED ORDER — LACTATED RINGERS IV SOLN
500.0000 mL | INTRAVENOUS | Status: DC | PRN
  Administered 2018-02-04: 500 mL via INTRAVENOUS

## 2018-02-04 MED ORDER — BUPIVACAINE HCL (PF) 0.25 % IJ SOLN
INTRAMUSCULAR | Status: DC | PRN
  Administered 2018-02-04 (×2): 5 mL via EPIDURAL

## 2018-02-04 MED ORDER — LIDOCAINE-EPINEPHRINE (PF) 1.5 %-1:200000 IJ SOLN
INTRAMUSCULAR | Status: DC | PRN
  Administered 2018-02-04: 3 mL via PERINEURAL

## 2018-02-04 MED ORDER — BUTORPHANOL TARTRATE 2 MG/ML IJ SOLN: 1.0000 mg | INTRAMUSCULAR | Status: DC | PRN

## 2018-02-04 MED ORDER — FENTANYL 2.5 MCG/ML W/ROPIVACAINE 0.15% IN NS 100 ML EPIDURAL (ARMC)
12.0000 mL/h | EPIDURAL | Status: DC
  Administered 2018-02-04 – 2018-02-05 (×3): 12 mL/h via EPIDURAL
  Filled 2018-02-04 (×2): qty 100

## 2018-02-04 MED ORDER — LIDOCAINE HCL (PF) 1 % IJ SOLN
INTRAMUSCULAR | Status: AC
  Filled 2018-02-04: qty 30

## 2018-02-04 MED ORDER — AMMONIA AROMATIC IN INHA
RESPIRATORY_TRACT | Status: AC
  Filled 2018-02-04: qty 10

## 2018-02-04 MED ORDER — FENTANYL 2.5 MCG/ML W/ROPIVACAINE 0.15% IN NS 100 ML EPIDURAL (ARMC)
EPIDURAL | Status: AC
  Filled 2018-02-04: qty 100

## 2018-02-04 MED ORDER — DIPHENHYDRAMINE HCL 50 MG/ML IJ SOLN: 12.5000 mg | INTRAMUSCULAR | Status: DC | PRN

## 2018-02-04 MED ORDER — OXYTOCIN 40 UNITS IN LACTATED RINGERS INFUSION - SIMPLE MED
1.0000 m[IU]/min | INTRAVENOUS | Status: DC
  Administered 2018-02-04: 2 m[IU]/min via INTRAVENOUS
  Filled 2018-02-04: qty 1000

## 2018-02-04 MED ORDER — ONDANSETRON HCL 4 MG/2ML IJ SOLN
4.0000 mg | Freq: Four times a day (QID) | INTRAMUSCULAR | Status: DC | PRN
  Administered 2018-02-04: 4 mg via INTRAVENOUS
  Filled 2018-02-04: qty 2

## 2018-02-04 MED ORDER — LACTATED RINGERS IV SOLN
INTRAVENOUS | Status: DC
  Administered 2018-02-04 – 2018-02-05 (×2): via INTRAVENOUS

## 2018-02-04 MED ORDER — MISOPROSTOL 50MCG HALF TABLET: 50.0000 ug | ORAL_TABLET | ORAL | Status: DC | PRN

## 2018-02-04 NOTE — H&P (Addendum)
Obstetric History and Physical  Vanessa Gallagher is a 27 y.o. G2P0 with IUP at 7010w2d presenting with contractions since 2300 02/03/2018. Patient states she has been having regular contractions, none vaginal bleeding, intact membranes, with active fetal movement.    Denies difficulty breathing or respiratory distress, chest pain, dysuria, and leg pain or swelling.   Prenatal Course  Source of Care: [facility]-initial visit 18 wks, total visits: 13  Pregnancy complications or risks: Delayed entry to care, History of marijuana use  Prenatal labs and studies:  ABO, Rh: --/--/O POS (07/24 0650)  Antibody: NEG (07/24 0650)  Rubella: 1.83 (02/11 [id number])  Varicella: 1,168 (02/11 0954)  RPR: Non Reactive (04/16 1444)   HBsAg: Negative (02/11 0954)   HIV: Non Reactive (02/11 0954)   MWU:XLKGMWNUGBS:Negative (06/18 1108)  1 hr Glucola: 82 (04/16 1444)  Genetic screening: Declined  Anatomy US: Complete, normal (02/11 1341)  History reviewed. No pertinent past medical history.  History reviewed. No pertinent surgical history.  OB History  Gravida Para Term Preterm AB Living  2            SAB TAB Ectopic Multiple Live Births               # Outcome Date GA Lbr Len/2nd Weight Sex Delivery Anes PTL Lv  2 Current           1 Gravida              Birth Comments: System Generated. Please review and update pregnancy details.    Social History   Socioeconomic History  . Marital status: Single    Spouse name: Not on file  . Number of children: Not on file  . Years of education: Not on file  . Highest education level: Not on file  Occupational History  . Not on file  Social Needs  . Financial resource strain: Not on file  . Food insecurity:    Worry: Not on file    Inability: Not on file  . Transportation needs:    Medical: Not on file    Non-medical: Not on file  Tobacco Use  . Smoking status: Current Every Day Smoker    Packs/day: 0.25    Years: 10.00    Pack years: 2.50  . Smokeless  tobacco: Never Used  Substance and Sexual Activity  . Alcohol use: Yes    Alcohol/week: 3.6 oz    Types: 6 Cans of beer per week  . Drug use: No  . Sexual activity: Yes    Birth control/protection: None  Lifestyle  . Physical activity:    Days per week: Not on file    Minutes per session: Not on file  . Stress: Not on file  Relationships  . Social connections:    Talks on phone: Not on file    Gets together: Not on file    Attends religious service: Not on file    Active member of club or organization: Not on file    Attends meetings of clubs or organizations: Not on file    Relationship status: Not on file  Other Topics Concern  . Not on file  Social History Narrative  . Not on file    History reviewed. No pertinent family history.  Facility-Administered Medications Prior to Admission  Medication Dose Route Frequency Provider Last Rate Last Dose  . Tdap (BOOSTRIX) injection 0.5 mL  0.5 mL Intramuscular Once Shambley, Melody N, CNM       Medications Prior  to Admission  Medication Sig Dispense Refill Last Dose  . cyclobenzaprine (FLEXERIL) 10 MG tablet Take 1 tablet (10 mg total) by mouth 3 (three) times daily as needed for muscle spasms. 30 tablet 2 Past Week at Unknown time  . doxylamine, Sleep, (UNISOM) 25 MG tablet    Past Week at Unknown time  . Prenatal-DSS-FeCb-FeGl-FA (CITRANATAL BLOOM) 90-1 MG TABS Take 1 tablet by mouth daily. 30 tablet 11 02/03/2018 at Unknown time  . ranitidine (ZANTAC) 150 MG tablet Take 1 tablet (150 mg total) by mouth 2 (two) times daily. (Patient not taking: Reported on 02/04/2018) 60 tablet 4 Not Taking at Unknown time  . zolpidem (AMBIEN) 5 MG tablet Take 1 tablet (5 mg total) by mouth at bedtime as needed for sleep. (Patient not taking: Reported on 02/04/2018) 5 tablet 0 Not Taking at Unknown time    No Known Allergies  Review of Systems: Negative except for what is mentioned in HPI.  Physical Exam:  Temp:  [97.4 F (36.3 C)-97.6 F  (36.4 C)] 97.6 F (36.4 C) (07/24 0741) Pulse Rate:  [81-85] 81 (07/24 0741) Resp:  [18] 18 (07/24 0445) BP: (131)/(91) 131/91 (07/24 0445) Weight:  [160 lb (72.6 kg)] 160 lb (72.6 kg) (07/24 0452)  GENERAL: Well-developed, well-nourished female in no acute distress.   LUNGS: Clear to auscultation bilaterally.   HEART: Regular rate and rhythm.  ABDOMEN: Soft, nontender, nondistended, gravid.  EXTREMITIES: Nontender, no edema, 2+ distal pulses.  Cervical Exam: Dilation: 2 Effacement (%): 80 Cervical Position: Middle Station: 0 Presentation: Vertex Exam by:: Cherry MD  FHT:  Baseline rate 145 bpm   Variability moderate  Accelerations present   Decelerations prolonged  Contractions: Every two (2) to four (4) minutes, soft resting tone   Pertinent Labs/Studies:    Results for orders placed or performed during the hospital encounter of 02/04/18 (from the past 24 hour(s))  CBC     Status: Abnormal   Collection Time: 02/04/18  6:50 AM  Result Value Ref Range   WBC 12.9 (H) 3.6 - 11.0 K/uL   RBC 4.12 3.80 - 5.20 MIL/uL   Hemoglobin 13.3 12.0 - 16.0 g/dL   HCT 16.1 09.6 - 04.5 %   MCV 93.8 80.0 - 100.0 fL   MCH 32.4 26.0 - 34.0 pg   MCHC 34.6 32.0 - 36.0 g/dL   RDW 40.9 (H) 81.1 - 91.4 %   Platelets 159 150 - 440 K/uL  Type and screen     Status: None   Collection Time: 02/04/18  6:50 AM  Result Value Ref Range   ABO/RH(D) O POS    Antibody Screen NEG    Sample Expiration      02/07/2018 Performed at [facility], [address] [place], [place] [address]     Assessment :  Vanessa Gallagher is a 27 y.o. G2P0 at [redacted]w[redacted]d being admitted for induction of labor, Rh positive, GBS negative   FHR Category II  Plan:  Admit to birthing suites, see orders.   Labor: Expectant management.  Delivery plan: Hopeful for vaginal delivery.   Dr Valentino Saxon at bedside to evaluated FHR tracing due to prolonged decelerations.    Gunnar Bulla, CNM [facility], [facility] 02/04/18 10:52 AM

## 2018-02-04 NOTE — Progress Notes (Signed)
Patient ID: Vanessa MooreSarah C Gallagher, female   DOB: 01/25/1991, 27 y.o.   MRN: [id number]  Vanessa MooreSarah C Gallagher is a 27 y.o. G2P0 at 8162w2d by LMP admitted for induction of labor  Subjective:  Patient resting in bed, reports pelvic pressure with contractions. Endorses relief of nausea since IV Zofran. FOB, doulas, and other family members at bedside for continuous labor support.   Denies difficulty breathing or respiratory distress, chest pain, abdominal pain, and leg pain.   Objective:  Temp:  [97.4 F (36.3 C)-98.4 F (36.9 C)] 98.4 F (36.9 C) (07/24 2054) Pulse Rate:  [81-110] 93 (07/24 2137) Resp:  [18] 18 (07/24 0445) BP: (101-139)/(46-93) 131/91 (07/24 2137) SpO2:  [100 %] 100 % (07/24 1356) Weight:  [160 lb (72.6 kg)] 160 lb (72.6 kg) (07/24 1747)  Fetal Wellbeing:  Category I  UC:   regular, every two (2) to three (3) minutes; pitocin 6 mu/min  SVE:   Dilation: 7 Effacement (%): 90 Station: 0 Exam by:: Vanessa Gallagher CNM  Labs: Lab Results  Component Value Date   WBC 12.9 (H) 02/04/2018   HGB 13.3 02/04/2018   HCT 38.6 02/04/2018   MCV 93.8 02/04/2018   PLT 159 02/04/2018    Assessment:  Vanessa ChiquitoSarah C Cobleis a 26 y.o.G2P0 at 6862w2d being admitted for labor, Rh positive, GBS negative, AROM, pitocin augmentation, IUPC  FHR Category I  Plan:  Encouraged pitocin tiration and moderate position change.   Reviewed red flags and when to call.   Continue orders as written. Reassess as needed.    Vanessa Gallagher, CNM [facility], [facility] 02/04/2018, 10:03 PM

## 2018-02-04 NOTE — Anesthesia Procedure Notes (Signed)
Epidural Patient location during procedure: OB Start time: 02/04/2018 1:25 PM End time: 02/04/2018 1:55 PM  Staffing Resident/CRNA: Junious SilkNoles, Loyd Marhefka, CRNA Performed: resident/CRNA   Preanesthetic Checklist Completed: patient identified, site marked, surgical consent, pre-op evaluation, timeout performed, IV checked, risks and benefits discussed and monitors and equipment checked  Epidural Patient position: sitting Prep: Betadine Patient monitoring: heart rate, continuous pulse ox and blood pressure Approach: midline Location: L3-L4 Injection technique: LOR saline  Needle:  Needle type: Tuohy  Needle gauge: 17 G Needle length: 9 cm and 9 Catheter type: closed end flexible Catheter size: 20 Guage Test dose: negative and 1.5% lidocaine with Epi 1:200 K  Assessment Sensory level: T10 Events: blood not aspirated, injection not painful, no injection resistance, negative IV test and no paresthesia  Additional Notes   Patient tolerated the insertion well without complications.Reason for block:procedure for pain

## 2018-02-04 NOTE — Progress Notes (Signed)
A. Janee Mornhompson called to check on patient. Allegra GranaA. Thompson gave verbal order for pt to shower. Pt SL and taken off monitor to shower

## 2018-02-04 NOTE — Progress Notes (Signed)
Called to respond to patient with prolonged deceleration of fetal heart rate from baseline 160s down to 60s 7 minutes.  At my arrival fetal heart tracing was returning to baseline, patient in hands/knees position with facemask O2. Had also received 1 dose of terbutaline.  Exam 2/70-80/0 station (changed from -2 station from nurse evaluation). After several minutes on hands and knees fetal heart tracing was more reassuring.  Patient allowed to return to left lateral tilt.  Fetal heart tracing remains reassuring.  Recently received dose of buccal Cytotec at 1012. Will contiue to monitor closely.  Deceleration likely secondary to rapid descent.    Hildred Laserherry, Danett Palazzo, MD [facility]

## 2018-02-04 NOTE — Progress Notes (Addendum)
Patient ID: Vanessa Gallagher, female   DOB: 06/13/1991, 27 y.o.   MRN: [id number]  Vanessa Gallagher is a 27 y.o. G2P0 at 2545w2d by LMP admitted for induction of labor  Subjective:  Patient is sitting in bed eating ice pop. Endorses pain relief since epidural placement. Voices frustration with her slow labor progress. Doula, FOB, and other family members at bedside for continuous labor support.   Denies difficulty breathing or respiratory distress, chest pain, abdominal pain, and leg pain.  Objective:  Temp:  [97.4 F (36.3 C)-98.2 F (36.8 C)] 97.8 F (36.6 C) (07/24 1615) Pulse Rate:  [81-110] 86 (07/24 1407) Resp:  [18] 18 (07/24 0445) BP: (108-139)/(46-93) 133/82 (07/24 1407) SpO2:  [100 %] 100 % (07/24 1356) Weight:  [160 lb (72.6 kg)] 160 lb (72.6 kg) (07/24 0452)   Fetal Wellbeing:  Category I  UC:   regular, every five (5) to seven (7) minutes, soft resting tone  SVE:   5/90/0, AROM small amount clear bloody fluid  Labs: Lab Results  Component Value Date   WBC 12.9 (H) 02/04/2018   HGB 13.3 02/04/2018   HCT 38.6 02/04/2018   MCV 93.8 02/04/2018   PLT 159 02/04/2018    Assessment:  Vanessa ChiquitoSarah C Cobleis a 26 y.o.G2P0 at 5845w2d being admitted for labor, Rh positive, GBS negative, AROM  FHR Category I  Plan:  IUPC inserted without difficulty. Will continue to monitor FHR tracing and start pitocin if reactive.   Encouraged position changes and use of peanut ball.   Reviewed red flag symptoms and when to call.   Continue orders as written. Reassess as needed.    Gunnar BullaJenkins Michelle Samie Barclift, CNM [facility], [facility] 02/04/2018, 5:09 PM

## 2018-02-04 NOTE — Anesthesia Preprocedure Evaluation (Signed)
Anesthesia Evaluation  Patient identified by MRN, date of birth, ID band Patient awake    Reviewed: Allergy & Precautions, H&P   Airway Mallampati: II  TM Distance: <3 FB Neck ROM: full    Dental no notable dental hx.    Pulmonary neg pulmonary ROS, Current Smoker,    Pulmonary exam normal        Cardiovascular negative cardio ROS Normal cardiovascular exam     Neuro/Psych negative neurological ROS     GI/Hepatic negative GI ROS, Neg liver ROS,   Endo/Other  negative endocrine ROS  Renal/GU negative Renal ROS  negative genitourinary   Musculoskeletal   Abdominal   Peds  Hematology negative hematology ROS (+)   Anesthesia Other Findings   Reproductive/Obstetrics negative OB ROS                             Anesthesia Physical Anesthesia Plan  ASA: II  Anesthesia Plan: Epidural   Post-op Pain Management:    Induction:   PONV Risk Score and Plan:   Airway Management Planned:   Additional Equipment:   Intra-op Plan:   Post-operative Plan:   Informed Consent: I have reviewed the patients History and Physical, chart, labs and discussed the procedure including the risks, benefits and alternatives for the proposed anesthesia with the patient or authorized representative who has indicated his/her understanding and acceptance.     Plan Discussed with: Anesthesiologist and CRNA  Anesthesia Plan Comments:         Anesthesia Quick Evaluation

## 2018-02-04 NOTE — Progress Notes (Addendum)
Patient ID: Vanessa MooreSarah C Gallagher, female   DOB: 01/10/1991, 27 y.o.   MRN: [id number]  Vanessa Gallagher is a 27 y.o. G2P0 at 8764w2d by LMP admitted for induction of labor  Subjective:  Patient resting quietly in bed, breathing nitrous oxide during contractions. FOB and doula at bedside for continuous labor support.   Denies difficulty breathing or respiratory distress, chest pain, vaginal bleeding, dysuria, and leg pain or swelling.   Objective:  Temp:  [97.4 F (36.3 C)-98.2 F (36.8 C)] 98.2 F (36.8 C) (07/24 1130) Pulse Rate:  [81-89] 89 (07/24 1130) Resp:  [18] 18 (07/24 0445) BP: (130-131)/(71-91) 130/71 (07/24 1130) Weight:  [160 lb (72.6 kg)] 160 lb (72.6 kg) (07/24 0452)  Fetal Wellbeing:  Category I  UC:   regular, every four (4) minutes per patient report  SVE:   Dilation: 4.5 Effacement (%): 80 Station: -1, 0 Exam by:: Christel Bai CNM  Labs: Lab Results  Component Value Date   WBC 12.9 (H) 02/04/2018   HGB 13.3 02/04/2018   HCT 38.6 02/04/2018   MCV 93.8 02/04/2018   PLT 159 02/04/2018    Assessment:  Vanessa Gallagher is a 27 y.o. G2P0 at 7864w2d being admitted for labor, Rh positive, GBS negative   FHR Category I  Plan:  May have epidural when desired.   Encouraged position changes and use of peanut ball.   Reviewed red flag symptoms and when to call.   Continue orders as written. Reassess as needed.    Gunnar BullaJenkins Michelle Brayden Brodhead, CNM [facility], [facility] 02/04/2018, 12:17 PM

## 2018-02-05 ENCOUNTER — Encounter: Payer: Self-pay | Admitting: *Deleted

## 2018-02-05 DIAGNOSIS — O480 Post-term pregnancy: Secondary | ICD-10-CM

## 2018-02-05 DIAGNOSIS — Z3A41 41 weeks gestation of pregnancy: Secondary | ICD-10-CM

## 2018-02-05 LAB — RPR: RPR Ser Ql: NONREACTIVE

## 2018-02-05 MED ORDER — IBUPROFEN 600 MG PO TABS
600.0000 mg | ORAL_TABLET | Freq: Four times a day (QID) | ORAL | Status: DC
  Administered 2018-02-05 – 2018-02-07 (×8): 600 mg via ORAL
  Filled 2018-02-05 (×8): qty 1

## 2018-02-05 MED ORDER — ONDANSETRON HCL 4 MG PO TABS: 4.0000 mg | ORAL_TABLET | ORAL | Status: DC | PRN

## 2018-02-05 MED ORDER — METHYLERGONOVINE MALEATE 0.2 MG/ML IJ SOLN: 0.2000 mg | INTRAMUSCULAR | Status: DC | PRN

## 2018-02-05 MED ORDER — METHYLERGONOVINE MALEATE 0.2 MG PO TABS: 0.2000 mg | ORAL_TABLET | ORAL | Status: DC | PRN

## 2018-02-05 MED ORDER — COCONUT OIL OIL: 1.0000 "application " | TOPICAL_OIL | Status: DC | PRN

## 2018-02-05 MED ORDER — ACETAMINOPHEN 325 MG PO TABS
650.0000 mg | ORAL_TABLET | ORAL | Status: DC | PRN
  Administered 2018-02-05 – 2018-02-07 (×4): 650 mg via ORAL
  Filled 2018-02-05 (×4): qty 2

## 2018-02-05 MED ORDER — WITCH HAZEL-GLYCERIN EX PADS: 1.0000 "application " | MEDICATED_PAD | CUTANEOUS | Status: DC | PRN

## 2018-02-05 MED ORDER — BENZOCAINE-MENTHOL 20-0.5 % EX AERO: 1.0000 "application " | INHALATION_SPRAY | CUTANEOUS | Status: DC | PRN

## 2018-02-05 MED ORDER — PRENATAL MULTIVITAMIN CH
1.0000 | ORAL_TABLET | Freq: Every day | ORAL | Status: DC
  Administered 2018-02-06: 1 via ORAL
  Filled 2018-02-05 (×2): qty 1

## 2018-02-05 MED ORDER — SODIUM CHLORIDE 0.9% FLUSH: 3.0000 mL | INTRAVENOUS | Status: DC | PRN

## 2018-02-05 MED ORDER — SENNOSIDES-DOCUSATE SODIUM 8.6-50 MG PO TABS
2.0000 | ORAL_TABLET | ORAL | Status: DC
  Administered 2018-02-06: 2 via ORAL
  Filled 2018-02-05: qty 2

## 2018-02-05 MED ORDER — DIBUCAINE 1 % RE OINT: 1.0000 "application " | TOPICAL_OINTMENT | RECTAL | Status: DC | PRN

## 2018-02-05 MED ORDER — DIPHENHYDRAMINE HCL 25 MG PO CAPS: 25.0000 mg | ORAL_CAPSULE | Freq: Four times a day (QID) | ORAL | Status: DC | PRN

## 2018-02-05 MED ORDER — SODIUM CHLORIDE 0.9% FLUSH
3.0000 mL | Freq: Two times a day (BID) | INTRAVENOUS | Status: DC
  Administered 2018-02-05: 3 mL via INTRAVENOUS

## 2018-02-05 MED ORDER — CEFAZOLIN SODIUM-DEXTROSE 2-4 GM/100ML-% IV SOLN
2.0000 g | Freq: Once | INTRAVENOUS | Status: AC
  Administered 2018-02-05: 2 g via INTRAVENOUS
  Filled 2018-02-05: qty 100

## 2018-02-05 MED ORDER — ONDANSETRON HCL 4 MG/2ML IJ SOLN: 4.0000 mg | INTRAMUSCULAR | Status: DC | PRN

## 2018-02-05 MED ORDER — SIMETHICONE 80 MG PO CHEW: 80.0000 mg | CHEWABLE_TABLET | ORAL | Status: DC | PRN

## 2018-02-05 MED ORDER — SODIUM CHLORIDE 0.9 % IV SOLN: 250.0000 mL | INTRAVENOUS | Status: DC | PRN

## 2018-02-05 NOTE — Progress Notes (Signed)
Called in by midwife Vanessa RoyalsMichelle Gallagher, CNM secondary to placental cord avulsion after NSVD.  At bedside, patient in no apparent distress.  Exam notes firm uterine fundus, placenta still completely attached to anterior uterine wall. Manual extraction of the placenta performed.  Ultrasound performed post-procedure, with thin endometrial stripe present, no apparent retained products, small blood clot present.  Will give dose of Ancef post-procedure.     Vanessa Gallagher, Vanessa Deerman, MD [facility]

## 2018-02-05 NOTE — Lactation Note (Signed)
This note was copied from a baby's chart. Lactation Consultation Note  Patient Name: Vanessa Gallagher [id number]'XToday's Date: 02/05/2018 Reason for consult: Follow-up assessment   Maternal Data    Feeding Feeding Type: Breast Fed  LATCH Score Latch: Repeated attempts needed to sustain latch, nipple held in mouth throughout feeding, stimulation needed to elicit sucking reflex.  Audible Swallowing: None  Type of Nipple: Flat  Comfort (Breast/Nipple): Soft / non-tender  Hold (Positioning): Assistance needed to correctly position infant at breast and maintain latch.  LATCH Score: 5  Interventions Interventions: DEBP;Hand express  Lactation Tools Discussed/Used Tools: Nipple Dorris CarnesShields Pump Review: Setup, frequency, and cleaning Initiated by:: Vanessa GandyAlicia Sola Margolis RN IBCLC Date initiated:: 02/05/18   Consult Status Consult Status: Follow-up Date: 02/05/18 Follow-up type: In-patient  Mother is positive for THC. Mother was explained the risks of marijuana use and was given a copy of ABM protocol 21 which explains substance use and breastfeeding. Mother verbalized understanding and states that she is no longer using mariajuana. Infant's toxicology screen is unknown at this time. Mother wants to breastfeed. Infant attempted to breastfeed and is sleepy and spitting up clear mucous. Mother was given a pump to pump colostrum to feed to infant. Mother was educated about cluster feeding and hunger cues and has been keeping infant skin to skin.   Vanessa Gallagher 02/05/2018, 5:51 PM

## 2018-02-06 LAB — CBC
HCT: 29.6 % — ABNORMAL LOW (ref 35.0–47.0)
Hemoglobin: 9.9 g/dL — ABNORMAL LOW (ref 12.0–16.0)
MCH: 32 pg (ref 26.0–34.0)
MCHC: 33.6 g/dL (ref 32.0–36.0)
MCV: 95.1 fL (ref 80.0–100.0)
PLATELETS: 110 10*3/uL — AB (ref 150–440)
RBC: 3.11 MIL/uL — AB (ref 3.80–5.20)
RDW: 15 % — AB (ref 11.5–14.5)
WBC: 11.4 10*3/uL — ABNORMAL HIGH (ref 3.6–11.0)

## 2018-02-06 LAB — SURGICAL PATHOLOGY

## 2018-02-06 MED ORDER — MAGNESIUM OXIDE 400 (241.3 MG) MG PO TABS
400.0000 mg | ORAL_TABLET | Freq: Every day | ORAL | Status: DC
  Administered 2018-02-06 – 2018-02-07 (×2): 400 mg via ORAL
  Filled 2018-02-06 (×2): qty 1

## 2018-02-06 MED ORDER — FERROUS SULFATE 325 (65 FE) MG PO TABS
325.0000 mg | ORAL_TABLET | Freq: Three times a day (TID) | ORAL | Status: DC
  Administered 2018-02-06 – 2018-02-07 (×3): 325 mg via ORAL
  Filled 2018-02-06 (×3): qty 1

## 2018-02-06 NOTE — Anesthesia Postprocedure Evaluation (Signed)
Anesthesia Post Note  Patient: Vanessa MooreSarah C Gallagher  Procedure(s) Performed: AN AD HOC LABOR EPIDURAL  Patient location during evaluation: [facility] Anesthesia Type: Epidural Level of consciousness: awake, oriented, awake and alert and patient cooperative Pain management: pain level controlled Vital Signs Assessment: post-procedure vital signs reviewed and stable Respiratory status: spontaneous breathing, nonlabored ventilation and respiratory function stable Cardiovascular status: stable Postop Assessment: no headache, no backache, patient able to bend at knees, no apparent nausea or vomiting, adequate PO intake and able to ambulate Anesthetic complications: no     Last Vitals:  Vitals:   02/05/18 1916 02/05/18 2324  BP: 124/85 134/89  Pulse: 83 80  Resp: 20 20  Temp: 36.7 C 36.7 C  SpO2: 99% 100%    Last Pain:  Vitals:   02/05/18 2324  TempSrc: Oral  PainSc:                  Lyn RecordsNoles,  Darl Brisbin R

## 2018-02-06 NOTE — Progress Notes (Signed)
Patient ID: Vanessa MooreSarah C Gallagher, female   DOB: 09/03/1990, 27 y.o.   MRN: [id number]  Post Partum Day # 1, s/p SVD  Subjective:  Patient resting quietly in bed with eyes closed. FOB at bedside holding infant.   Objective:  Temp:  [98 F (36.7 C)-98.4 F (36.9 C)] 98.2 F (36.8 C) (07/26 0847) Pulse Rate:  [65-83] 65 (07/26 0847) Resp:  [16-20] 20 (07/26 0847) BP: (124-137)/(75-90) 137/90 (07/26 0847) SpO2:  [98 %-100 %] 99 % (07/26 0847)  Physical Exam: Patient sleeping and not awaken for exam, see morning assessment from RN.   Recent Labs    02/04/18 0650 02/06/18 0607  HGB 13.3 9.9*  HCT 38.6 29.6*    Assessment:  27 y.o G1P1, PPD #1, s/p spontaneous vaginal birth with manual extraction of placenta, Rh positive, breastfeeding, history of anemia  Plan:  Iron and magnesium supplementation, see orders.   Continue orders as written. Reassess as needed.   Plan for discharge tomorrow.    LOS: 2 days   Gunnar BullaJenkins Michelle Darryle Dennie, CNM [facility] 02/06/2018 11:42 AM

## 2018-02-06 NOTE — Lactation Note (Signed)
This note was copied from a baby's chart. Lactation Consultation Note  Patient Name: Boy Marya FossaSarah Wojtowicz ZOXWR'UToday's Date: 02/06/2018  Mom was (+) for MJ, but Vanessa Gallagher's urine tested negative.  Reviewed once again risks to infant of continuing MJ use while breast feeding.  Mom assured me once again that she was not going to use MJ while she was breast feeding.  Mom is breast feeding and giving bottles of formula.  After several attempts, was able to get Vanessa Gallagher latched to the breast for a sucking and a few swallows.  Vanessa Gallagher is not remaining latched to the breast for longer than 5 minutes.  Mom reports sore nipples.  RN has already given her coconut oil.  Comfort gels given and instructed in use.  Explained supply and demand, normal course of lactation and routine newborn feeding patterns.  Lactation name and number written on white board and encouraged to call with any questions, concerns or assistance.   Maternal Data    Feeding    LATCH Score                   Interventions    Lactation Tools Discussed/Used     Consult Status      Vanessa Gallagher, Vanessa Gallagher Kay 02/06/2018, 8:47 PM

## 2018-02-07 MED ORDER — MAGNESIUM OXIDE 400 (241.3 MG) MG PO TABS: 400.0000 mg | ORAL_TABLET | Freq: Every day | ORAL | 3 refills | Status: DC

## 2018-02-07 MED ORDER — ACETAMINOPHEN 325 MG PO TABS: 650.0000 mg | ORAL_TABLET | ORAL | 1 refills | Status: DC | PRN

## 2018-02-07 MED ORDER — IBUPROFEN 600 MG PO TABS: 600.0000 mg | ORAL_TABLET | Freq: Four times a day (QID) | ORAL | 0 refills | Status: DC

## 2018-02-07 MED ORDER — FERROUS SULFATE 325 (65 FE) MG PO TABS: 325.0000 mg | ORAL_TABLET | Freq: Three times a day (TID) | ORAL | 3 refills | Status: DC

## 2018-02-07 NOTE — Progress Notes (Signed)
Pt discharged with infant.  Discharge instructions, prescriptions and follow up appointment given to and reviewed with pt. Pt verbalized understanding. Escorted out by auxillary. 

## 2018-02-07 NOTE — Discharge Instructions (Signed)
Postpartum Care After Vaginal Delivery The period of time right after you deliver your newborn is called the postpartum period. What kind of medical care will I receive?  You may continue to receive fluids and medicines through an IV tube inserted into one of your veins.  If an incision was made near your vagina (episiotomy) or if you had some vaginal tearing during delivery, cold compresses may be placed on your episiotomy or your tear. This helps to reduce pain and swelling.  You may be given a squirt bottle to use when you go to the bathroom. You may use this until you are comfortable wiping as usual. To use the squirt bottle, follow these steps: ? Before you urinate, fill the squirt bottle with warm water. Do not use hot water. ? After you urinate, while you are sitting on the toilet, use the squirt bottle to rinse the area around your urethra and vaginal opening. This rinses away any urine and blood. ? You may do this instead of wiping. As you start healing, you may use the squirt bottle before wiping yourself. Make sure to wipe gently. ? Fill the squirt bottle with clean water every time you use the bathroom.  You will be given sanitary pads to wear. How can I expect to feel?  You may not feel the need to urinate for several hours after delivery.  You will have some soreness and pain in your abdomen and vagina.  If you are breastfeeding, you may have uterine contractions every time you breastfeed for up to several weeks postpartum. Uterine contractions help your uterus return to its normal size.  It is normal to have vaginal bleeding (lochia) after delivery. The amount and appearance of lochia is often similar to a menstrual period in the first week after delivery. It will gradually decrease over the next few weeks to a dry, yellow-brown discharge. For most women, lochia stops completely by 6-8 weeks after delivery. Vaginal bleeding can vary from woman to woman.  Within the first few  days after delivery, you may have breast engorgement. This is when your breasts feel heavy, full, and uncomfortable. Your breasts may also throb and feel hard, tightly stretched, warm, and tender. After this occurs, you may have milk leaking from your breasts.Your health care provider can help you relieve discomfort due to breast engorgement. Breast engorgement should go away within a few days.  You may feel more sad or worried than normal due to hormonal changes after delivery. These feelings should not last more than a few days. If these feelings do not go away after several days, speak with your health care provider. How should I care for myself?  Tell your health care provider if you have pain or discomfort.  Drink enough water to keep your urine clear or pale yellow.  Wash your hands thoroughly with soap and water for at least 20 seconds after changing your sanitary pads, after using the toilet, and before holding or feeding your baby.  If you are not breastfeeding, avoid touching your breasts a lot. Doing this can make your breasts produce more milk.  If you become weak or lightheaded, or you feel like you might faint, ask for help before: ? Getting out of bed. ? Showering.  Change your sanitary pads frequently. Watch for any changes in your flow, such as a sudden increase in volume, a change in color, the passing of large blood clots. If you pass a blood clot from your vagina, save it  to show to your health care provider. Do not flush blood clots down the toilet without having your health care provider look at them.  Make sure that all your vaccinations are up to date. This can help protect you and your baby from getting certain diseases. You may need to have immunizations done before you leave the hospital.  If desired, talk with your health care provider about methods of family planning or birth control (contraception). How can I start bonding with my baby? Spending as much time as  possible with your baby is very important. During this time, you and your baby can get to know each other and develop a bond. Having your baby stay with you in your room (rooming in) can give you time to get to know your baby. Rooming in can also help you become comfortable caring for your baby. Breastfeeding can also help you bond with your baby. How can I plan for returning home with my baby?  Make sure that you have a car seat installed in your vehicle. ? Your car seat should be checked by a certified car seat installer to make sure that it is installed safely. ? Make sure that your baby fits into the car seat safely.  Ask your health care provider any questions you have about caring for yourself or your baby. Make sure that you are able to contact your health care provider with any questions after leaving the hospital. This information is not intended to replace advice given to you by your health care provider. Make sure you discuss any questions you have with your health care provider. Document Released: 04/28/2007 Document Revised: 12/04/2015 Document Reviewed: 06/05/2015 Elsevier Interactive Patient Education  2018 [place] of a Perineal Tear A perineal tear is a cut (laceration) in the tissue between the opening of the vagina and the anus (perineum). Some women naturally develop a perineal tear during a vaginal birth. This can happen as the baby emerges from the birth canal and the perineum is stretched. Perineal tears are graded based on how deep and long the laceration is. The grading for perineal tears is as follows:  First degree. This involves a shallow tear at the edge of the vaginal opening that extends slightly into the perineal skin.  Second degree. This involves tearing described in a first degree perineal tear and also a deeper tear of the vaginal opening and perineal tissues. It may also include tearing of a muscle just under the perineal skin.  Third degree. This  involves tearing described in a first and second degree perineal tear, with the tear extending into the muscle of the anus (anal sphincter).  Fourth degree. This involves all levels of tear described for first, second, and third degree perineal tear, with the tear extending into the rectum.  First degree perineal tears may or may not be stitched closed, depending on their location and appearance. Second, third, and fourth degree perineal tears are stitched closed immediately after the babys birth. What are the risks? Depending on the type of perineal tear you have, you may be at risk for the following:  Bleeding.  Developing a collection of blood in the perineal tear area (hematoma).  Pain. This may include pain with urination or bowel movements.  Infection at the site of the tear.  Fever.  Trouble controlling your bowels (fecal incontinence).  Painful sexual intercourse.  How to care for a perineal tear  The first day, put ice on the area of the  tear. ? Put ice in a plastic bag. ? Place a towel between your skin and the bag. ? Leave the ice on for 20 minutes, 2-3 times a day.  Bathe using a warm sitz bath as directed by your health care provider. This can speed up healing. Sitz baths can be performed in your bathtub or using a sitz bath kit that fits over your toilet. ? Place 3-4 in. (7.6-10 cm) of warm water in your bathtub or fill the sitz bath over-the-toilet container with warm water. Make sure the water is not too hot by placing a drop on your wrist. ? Sit in the warm water for 20-30 minutes. ? After bathing, pat your perineum dry with a clean towel. Do not scrub the perineum as this could cause pain, irritation, or open any stitches you may have. ? Keep the over-the-toilet sitz bath container clean by rinsing it thoroughly after each use. Ask for help in keeping the bathtub clean with diluted bleach and water (2 Tbsp [30 mL] of bleach to  gal [1.9 L] of water). ? Repeat the  sitz bath as often as you would like to relieve perineal pain, itching, or discomfort.  Apply a numbing spray to the perineal tear site as directed by your health care provider. This may help with discomfort.  Wash your hands before and after applying medicine to the area.  Put about 3 witch hazel-containing hemorrhoid treatment pads on top of your sanitary pad. The witch hazel in the hemorrhoid pads helps with discomfort and swelling.  Get a squeeze bottle to squeeze warm water on your perineum when urinating, spraying the area from front to back. Pat the area to dry it.  Sitting on an inflatable ring or pillow may provide comfort.  Take medicines only as directed by your health care provider.  Do not have sexual intercourse or use tampons until your health care provider says it is okay. Typically, you must wait at least 6 weeks.  Keep all postpartum appointments as directed by your health care provider. Contact a health care provider if:  Your pain is not relieved with medicines.  You have painful urination.  You have a fever. Get help right away if:  You have redness, swelling, or increasing pain in the area of the tear.  You have pus coming from the area of the tear.  You notice a bad smell coming from the area of the tear.  Your tear opens.  You notice swelling in the area of the tear that is larger than when you left the hospital.  You cannot urinate. This information is not intended to replace advice given to you by your health care provider. Make sure you discuss any questions you have with your health care provider. Document Released: 11/15/2013 Document Revised: 12/13/2015 Document Reviewed: 04/06/2013 Elsevier Interactive Patient Education  2017 [place] Instructions for Mom ACTIVITY  Gradually return to your regular activities.  Let yourself rest. Nap while your baby sleeps.  Avoid lifting anything that is heavier than 10 lb (4.5 kg) until  your health care provider says it is okay.  Avoid activities that take a lot of effort and energy (are strenuous) until approved by your health care provider. Walking at a slow-to-moderate pace is usually safe.  If you had a cesarean delivery: ? Do not vacuum, climb stairs, or drive a car for 4-6 weeks. ? Have someone help you at home until you feel like you can do your usual activities  yourself. ? Do exercises as told by your health care provider, if this applies.  VAGINAL BLEEDING You may continue to bleed for 4-6 weeks after delivery. Over time, the amount of blood usually decreases and the color of the blood usually gets lighter. However, the flow of bright red blood may increase if you have been too active. If you need to use more than one pad in an hour because your pad gets soaked, or if you pass a large clot:  Lie down.  Raise your feet.  Place a cold compress on your lower abdomen.  Rest.  Call your health care provider.  If you are breastfeeding, your period should return anytime between 8 weeks after delivery and the time that you stop breastfeeding. If you are not breastfeeding, your period should return 6-8 weeks after delivery. PERINEAL CARE The perineal area, or perineum, is the part of your body between your thighs. After delivery, this area needs special care. Follow these instructions as told by your health care provider.  Take warm tub baths for 15-20 minutes.  Use medicated pads and pain-relieving sprays and creams as told.  Do not use tampons or douches until vaginal bleeding has stopped.  Each time you go to the bathroom: ? Use a peri bottle. ? Change your pad. ? Use towelettes in place of toilet paper until your stitches have healed.  Do Kegel exercises every day. Kegel exercises help to maintain the muscles that support the vagina, bladder, and bowels. You can do these exercises while you are standing, sitting, or lying down. To do Kegel  exercises: ? Tighten the muscles of your abdomen and the muscles that surround your birth canal. ? Hold for a few seconds. ? Relax. ? Repeat until you have done this 5 times in a row.  To prevent hemorrhoids from developing or getting worse: ? Drink enough fluid to keep your urine clear or pale yellow. ? Avoid straining when having a bowel movement. ? Take over-the-counter medicines and stool softeners as told by your health care provider.  BREAST CARE  Wear a tight-fitting bra.  Avoid taking over-the-counter pain medicine for breast discomfort.  Apply ice to the breasts to help with discomfort as needed: ? Put ice in a plastic bag. ? Place a towel between your skin and the bag. ? Leave the ice on for 20 minutes or as told by your health care provider.  NUTRITION  Eat a well-balanced diet.  Do not try to lose weight quickly by cutting back on calories.  Take your prenatal vitamins until your postpartum checkup or until your health care provider tells you to stop.  POSTPARTUM DEPRESSION You may find yourself crying for no apparent reason and unable to cope with all of the changes that come with having a newborn. This mood is called postpartum depression. Postpartum depression happens because your hormone levels change after delivery. If you have postpartum depression, get support from your partner, friends, and family. If the depression does not go away on its own after several weeks, contact your health care provider. BREAST SELF-EXAM Do a breast self-exam each month, at the same time of the month. If you are breastfeeding, check your breasts just after a feeding, when your breasts are less full. If you are breastfeeding and your period has started, check your breasts on day 5, 6, or 7 of your period. Report any lumps, bumps, or discharge to your health care provider. Know that breasts are normally lumpy if you are  breastfeeding. This is temporary, and it is not a health  risk. INTIMACY AND SEXUALITY Avoid sexual activity for at least 3-4 weeks after delivery or until the brownish-red vaginal flow is completely gone. If you want to avoid pregnancy, use some form of birth control. You can get pregnant after delivery, even if you have not had your period. SEEK MEDICAL CARE IF:  You feel unable to cope with the changes that a child brings to your life, and these feelings do not go away after several weeks.  You notice a lump, a bump, or discharge on your breast.  SEEK IMMEDIATE MEDICAL CARE IF:  Blood soaks your pad in 1 hour or less.  You have: ? Severe pain or cramping in your lower abdomen. ? A bad-smelling vaginal discharge. ? A fever that is not controlled by medicine. ? A fever, and an area of your breast is red and sore. ? Pain or redness in your calf. ? Sudden, severe chest pain. ? Shortness of breath. ? Painful or bloody urination. ? Problems with your vision.  You vomit for 12 hours or longer.  You develop a severe headache.  You have serious thoughts about hurting yourself, your child, or anyone else.  This information is not intended to replace advice given to you by your health care provider. Make sure you discuss any questions you have with your health care provider. Document Released: 06/28/2000 Document Revised: 12/07/2015 Document Reviewed: 01/02/2015 Elsevier Interactive Patient Education  2017 [place]. Postpartum Depression and Baby Blues The postpartum period begins right after the birth of a baby. During this time, there is often a great amount of joy and excitement. It is also a time of many changes in the life of the parents. Regardless of how many times a mother gives birth, each child brings new challenges and dynamics to the family. It is not unusual to have feelings of excitement along with confusing shifts in moods, emotions, and thoughts. All mothers are at risk of developing postpartum depression or the "baby blues."  These mood changes can occur right after giving birth, or they may occur many months after giving birth. The baby blues or postpartum depression can be mild or severe. Additionally, postpartum depression can go away rather quickly, or it can be a long-term condition. What are the causes? Raised hormone levels and the rapid drop in those levels are thought to be a main cause of postpartum depression and the baby blues. A number of hormones change during and after pregnancy. Estrogen and progesterone usually decrease right after the delivery of your baby. The levels of thyroid hormone and various cortisol steroids also rapidly drop. Other factors that play a role in these mood changes include major life events and genetics. What increases the risk? If you have any of the following risks for the baby blues or postpartum depression, know what symptoms to watch out for during the postpartum period. Risk factors that may increase the likelihood of getting the baby blues or postpartum depression include:  Having a personal or family history of depression.  Having depression while being pregnant.  Having premenstrual mood issues or mood issues related to oral contraceptives.  Having a lot of life stress.  Having marital conflict.  Lacking a social support network.  Having a baby with special needs.  Having health problems, such as diabetes.  What are the signs or symptoms? Symptoms of baby blues include:  Brief changes in mood, such as going from extreme happiness to  sadness.  Decreased concentration.  Difficulty sleeping.  Crying spells, tearfulness.  Irritability.  Anxiety.  Symptoms of postpartum depression typically begin within the first month after giving birth. These symptoms include:  Difficulty sleeping or excessive sleepiness.  Marked weight loss.  Agitation.  Feelings of worthlessness.  Lack of interest in activity or food.  Postpartum psychosis is a very serious  condition and can be dangerous. Fortunately, it is rare. Displaying any of the following symptoms is cause for immediate medical attention. Symptoms of postpartum psychosis include:  Hallucinations and delusions.  Bizarre or disorganized behavior.  Confusion or disorientation.  How is this diagnosed? A diagnosis is made by an evaluation of your symptoms. There are no medical or lab tests that lead to a diagnosis, but there are various questionnaires that a health care provider may use to identify those with the baby blues, postpartum depression, or psychosis. Often, a screening tool called the [place] Postnatal Depression Scale is used to diagnose depression in the postpartum period. How is this treated? The baby blues usually goes away on its own in 1-2 weeks. Social support is often all that is needed. You will be encouraged to get adequate sleep and rest. Occasionally, you may be given medicines to help you sleep. Postpartum depression requires treatment because it can last several months or longer if it is not treated. Treatment may include individual or group therapy, medicine, or both to address any social, physiological, and psychological factors that may play a role in the depression. Regular exercise, a healthy diet, rest, and social support may also be strongly recommended. Postpartum psychosis is more serious and needs treatment right away. Hospitalization is often needed. Follow these instructions at home:  Get as much rest as you can. Nap when the baby sleeps.  Exercise regularly. Some women find yoga and walking to be beneficial.  Eat a balanced and nourishing diet.  Do little things that you enjoy. Have a cup of tea, take a bubble bath, read your favorite magazine, or listen to your favorite music.  Avoid alcohol.  Ask for help with household chores, cooking, grocery shopping, or running errands as needed. Do not try to do everything.  Talk to people close to you about  how you are feeling. Get support from your partner, family members, friends, or other new moms.  Try to stay positive in how you think. Think about the things you are grateful for.  Do not spend a lot of time alone.  Only take over-the-counter or prescription medicine as directed by your health care provider.  Keep all your postpartum appointments.  Let your health care provider know if you have any concerns. Contact a health care provider if: You are having a reaction to or problems with your medicine. Get help right away if:  You have suicidal feelings.  You think you may harm the baby or someone else. This information is not intended to replace advice given to you by your health care provider. Make sure you discuss any questions you have with your health care provider. Document Released: 04/04/2004 Document Revised: 12/07/2015 Document Reviewed: 04/12/2013 Elsevier Interactive Patient Education  2017 [employer]. Breastfeeding Challenges and Solutions Even though breastfeeding is natural, it can be challenging, especially in the first few weeks after childbirth. It is normal for problems to arise when starting to breastfeed your new baby, even if you have breastfed before. This document provides some solutions to the most common breastfeeding challenges. Challenges and solutions Challenge--Cracked or Sore Nipples  Cracked or sore nipples are commonly experienced by breastfeeding mothers. Cracked or sore nipples often are caused by inadequate latching (when your baby's mouth attaches to your breast to breastfeed). Soreness can also happen if your baby is not positioned properly at your breast. Although nipple cracking and soreness are common during the first week after birth, nipple pain is never normal. If you experience nipple cracking or soreness that lasts longer than 1 week or nipple pain, call your health care provider or lactation consultant. Solution Ensure proper latching and  positioning of your baby by following the steps below:  Find a comfortable place to sit or lie down, with your neck and back well supported.  Place a pillow or rolled up blanket under your baby to bring him or her to the level of your breast (if you are seated).  Make sure that your baby's abdomen is facing your abdomen.  Gently massage your breast. With your fingertips, massage from your chest wall toward your nipple in a circular motion. This encourages milk flow. You may need to continue this action during the feeding if your milk flows slowly.  Support your breast with 4 fingers underneath and your thumb above your nipple. Make sure your fingers are well away from your nipple and your babys mouth.  Stroke your baby's lips gently with your finger or nipple.  When your baby's mouth is open wide enough, quickly bring your baby to your breast, placing your entire nipple and as much of the colored area around your nipple (areola) as possible into your baby's mouth. ? More areola should be visible above your baby's upper lip than below the lower lip. ? Your baby's tongue should be between his or her lower gum and your breast.  Ensure that your baby's mouth is correctly positioned around your nipple (latched). Your baby's lips should create a seal on your breast and be turned out (everted).  It is common for your baby to suck for about 2-3 minutes in order to start the flow of breast milk.  Signs that your baby has successfully latched on to your nipple include:  Quietly tugging or quietly sucking without causing you pain.  Swallowing heard between every 3-4 sucks.  Muscle movement above and in front of his or her ears with sucking.  Signs that your baby has not successfully latched on to nipple include:  Sucking sounds or smacking sounds from your baby while nursing.  Nipple pain.  Ensure that your breasts stay moisturized and healthy by:  Avoiding the use of soap on your  nipples.  Wearing a supportive bra. Avoid wearing underwire-style bras or tight bras.  Air drying your nipples for 3-4 minutes after each feeding.  Using only cotton bra pads to absorb breast milk leakage. Leaking of breast milk between feedings is normal. Be sure to change the pads if they become soaked with milk.  Using lanolin on your nipples after nursing. Lanolin helps to maintain your skin's normal moisture barrier. If you use pure lanolin you do not need to wash it off before feeding your baby again. Pure lanolin is not toxic to your baby. You may also hand express a few drops of breast milk and gently massage that milk into your nipples, allowing it to air dry.  Challenge--Breast Engorgement Breast engorgement is the overfilling of your breasts with breast milk. In the first few weeks after giving birth, you may experience breast engorgement. Breast engorgement can make your breasts throb and feel hard,  tightly stretched, warm, and tender. Engorgement peaks about the fifth day after you give birth. Having breast engorgement does not mean you have to stop breastfeeding your baby. Solution  Breastfeed when you feel the need to reduce the fullness of your breasts or when your baby shows signs of hunger. This is called "breastfeeding on demand."  Newborns (babies younger than 4 weeks) often breastfeed every 1-3 hours during the day. You may need to awaken your baby to feed if he or she is asleep at a feeding time.  Do not allow your baby to sleep longer than 5 hours during the night without a feeding.  Pump or hand express breast milk before breastfeeding to soften your breast, areola, and nipple.  Apply warm, moist heat (in the shower or with warm water-soaked hand towels) just before feeding or pumping, or massage your breast before or during breastfeeding. This increases circulation and helps your milk to flow.  Completely empty your breasts when breastfeeding or pumping. Afterward,  wear a snug bra (nursing or regular) or tank top for 1-2 days to signal your body to slightly decrease milk production. Only wear snug bras or tank tops to treat engorgement. Tight bras typically should be avoided by breastfeeding mothers. Once engorgement is relieved, return to wearing regular, loose-fitting clothes.  Apply ice packs to your breasts to lessen the pain from engorgement and relieve swelling, unless the ice is uncomfortable for you.  Do not delay feedings. Try to relax when it is time to feed your baby. This helps to trigger your "let-down reflex," which releases milk from your breast.  Ensure your baby is latched on to your breast and positioned properly while breastfeeding.  Allow your baby to remain at your breast as long as he or she is latched on well and actively sucking. Your baby will let you know when he or she is done breastfeeding by pulling away from your breast or falling asleep.  Avoid introducing bottles or pacifiers to your baby in the early weeks of breastfeeding. Wait to introduce these things until after resolving any breastfeeding challenges.  Try to pump your milk on the same schedule as when your baby would breastfeed if you are returning to work or away from home for an extended period.  Drink plenty of fluids to avoid dehydration, which can eventually put you at greater risk of breast engorgement.  If you follow these suggestions, your engorgement should improve in 24-48 hours. If you are still experiencing difficulty, call your lactation consultant or health care provider. Challenge--Plugged Milk Ducts Plugged milk ducts occur when the duct does not drain milk effectively and becomes swollen. Wearing a tight-fitting nursing bra or having difficulty with latching may cause plugged milk ducts. Not drinking enough water (8-10 c [1.9-2.4 L] per day) can contribute to plugged milk ducts. Once a duct has become plugged, hard lumps, soreness, and redness may develop  in your breast. Solution Do not delay feedings. Feed your baby frequently and try to empty your breasts of milk at each feeding. Try breastfeeding from the affected side first so there is a better chance that the milk will drain completely from that breast. Apply warm, moist towels to your breasts for 5-10 minutes before feeding. Alternatively, a hot shower right before breastfeeding can provide the moist heat that can encourage milk flow. Gentle massage of the sore area before and during a feeding may also help. Avoid wearing tight clothing or bras that put pressure on your breasts. Wear  bras that offer good support to your breasts, but avoid underwire bras. If you have a plugged milk duct and develop a fever, you need to see your health care provider. Challenge--Mastitis Mastitis is inflammation of your breast. It usually is caused by a bacterial infection and can cause flu-like symptoms. You may develop redness in your breast and a fever. Often when mastitis occurs, your breast becomes firm, warm, and very painful. The most common causes of mastitis are poor latching, ineffective sucking from your baby, consistent pressure on your breast (possibly from wearing a tight-fitting bra or shirt that restricts the milk flow), unusual stress or fatigue, or missed feedings. Solution You will be given antibiotic medicine to treat the infection. It is still important to breastfeed frequently to empty your breasts. Continuing to breastfeed while you recover from mastitis will not harm your baby. Make sure your baby is positioned properly during every feeding. Apply moist heat to your breasts for a few minutes before feeding to help the milk flow and to help your breasts empty more easily. Challenge--Thrush Ritta Slot is a yeast infection that can form on your nipples, in your breast, or in your baby's mouth. It causes itching, soreness, burning or stabbing pain, and sometimes a rash. Solution You will be given a  medicated ointment for your nipples, and your baby will be given a liquid medicine for his or her mouth. It is important that you and your baby are treated at the same time because thrush can be passed between you and your baby. Change disposable nursing pads often. Any bras, towels, or clothing that come in contact with infected areas of your body or your baby's body need to be washed in very hot water every day. Wash your hands and your baby's hands often. All pacifiers, bottle nipples, or toys your baby puts in his or her mouth should be boiled once a day for 20 minutes. After 1 week of treatment, discard pacifiers and bottle nipples and buy new ones. All breast pump parts that touch the milk need to be boiled for 20 minutes every day. Challenge--Low Milk Supply You may not be producing enough milk if your baby is not gaining the proper amount of weight. Breast milk production is based on a supply-and-demand system. Your milk supply depends on how frequently and effectively your baby empties your breast. Solution The more you breastfeed and pump, the more breast milk you will produce. It is important that your baby empties at least one of your breasts at each feeding. If this is not happening, then use a breast pump or hand express any milk that remains. This will help to drain as much milk as possible at each feeding. It will also signal your body to produce more milk. If your baby is not emptying your breasts, it may be due to latching, sucking, or positioning problems. If low milk supply continues after addressing these issues, contact your health care provider or a lactation specialist as soon as possible. Challenge--Inverted or Flat Nipples Some women have nipples that turn inward instead of protruding outward. Other women have nipples that are flat. Inverted or flat nipples can sometimes make it more difficult for your baby to latch onto your breast. Solution You may be given a small device that  pulls out inverted nipples. This device should be applied right before your baby is brought to your breast. You can also try using a breast pump for a short time before placing the baby at your  breast. The pump can pull your nipple outwards to help your infant latch more easily. The baby's sucking motion will help the inverted nipple protrude as well. If you have flat nipples, encourage your baby to latch onto your breast and feed frequently in the early days after birth. This will give your baby practice latching on correctly while your breast is still soft. When your milk supply increases, between the second and fifth day after birth and your breasts become full, your baby will have an easier time latching. Contact a lactation consultant if you still have concerns. She or he can teach you additional techniques to address breastfeeding problems related to nipple shape and position. Where to find more information: [employer]: www.llli.org This information is not intended to replace advice given to you by your health care provider. Make sure you discuss any questions you have with your health care provider. Document Released: 12/23/2005 Document Revised: 12/13/2015 Document Reviewed: 12/25/2012 Elsevier Interactive Patient Education  2017 [employer]. Breastfeeding Choosing to breastfeed is one of the best decisions you can make for yourself and your baby. A change in hormones during pregnancy causes your breasts to make breast milk in your milk-producing glands. Hormones prevent breast milk from being released before your baby is born. They also prompt milk flow after birth. Once breastfeeding has begun, thoughts of your baby, as well as his or her sucking or crying, can stimulate the release of milk from your milk-producing glands. Benefits of breastfeeding Research shows that breastfeeding offers many health benefits for infants and mothers. It also offers a cost-free and convenient  way to feed your baby. For your baby  Your first milk (colostrum) helps your baby's digestive system to function better.  Special cells in your milk (antibodies) help your baby to fight off infections.  Breastfed babies are less likely to develop asthma, allergies, obesity, or type 2 diabetes. They are also at lower risk for sudden infant death syndrome (SIDS).  Nutrients in breast milk are better able to meet your babys needs compared to infant formula.  Breast milk improves your baby's brain development. For you  Breastfeeding helps to create a very special bond between you and your baby.  Breastfeeding is convenient. Breast milk costs nothing and is always available at the correct temperature.  Breastfeeding helps to burn calories. It helps you to lose the weight that you gained during pregnancy.  Breastfeeding makes your uterus return faster to its size before pregnancy. It also slows bleeding (lochia) after you give birth.  Breastfeeding helps to lower your risk of developing type 2 diabetes, osteoporosis, rheumatoid arthritis, cardiovascular disease, and breast, ovarian, uterine, and endometrial cancer later in life. Breastfeeding basics Starting breastfeeding  Find a comfortable place to sit or lie down, with your neck and back well-supported.  Place a pillow or a rolled-up blanket under your baby to bring him or her to the level of your breast (if you are seated). Nursing pillows are specially designed to help support your arms and your baby while you breastfeed.  Make sure that your baby's tummy (abdomen) is facing your abdomen.  Gently massage your breast. With your fingertips, massage from the outer edges of your breast inward toward the nipple. This encourages milk flow. If your milk flows slowly, you may need to continue this action during the feeding.  Support your breast with 4 fingers underneath and your thumb above your nipple (make the letter "C" with your hand).  Make sure your  fingers are well away from your nipple and your babys mouth.  Stroke your baby's lips gently with your finger or nipple.  When your baby's mouth is open wide enough, quickly bring your baby to your breast, placing your entire nipple and as much of the areola as possible into your baby's mouth. The areola is the colored area around your nipple. ? More areola should be visible above your baby's upper lip than below the lower lip. ? Your baby's lips should be opened and extended outward (flanged) to ensure an adequate, comfortable latch. ? Your baby's tongue should be between his or her lower gum and your breast.  Make sure that your baby's mouth is correctly positioned around your nipple (latched). Your baby's lips should create a seal on your breast and be turned out (everted).  It is common for your baby to suck about 2-3 minutes in order to start the flow of breast milk. Latching Teaching your baby how to latch onto your breast properly is very important. An improper latch can cause nipple pain, decreased milk supply, and poor weight gain in your baby. Also, if your baby is not latched onto your nipple properly, he or she may swallow some air during feeding. This can make your baby fussy. Burping your baby when you switch breasts during the feeding can help to get rid of the air. However, teaching your baby to latch on properly is still the best way to prevent fussiness from swallowing air while breastfeeding. Signs that your baby has successfully latched onto your nipple  Silent tugging or silent sucking, without causing you pain. Infant's lips should be extended outward (flanged).  Swallowing heard between every 3-4 sucks once your milk has started to flow (after your let-down milk reflex occurs).  Muscle movement above and in front of his or her ears while sucking.  Signs that your baby has not successfully latched onto your nipple  Sucking sounds or smacking sounds from  your baby while breastfeeding.  Nipple pain.  If you think your baby has not latched on correctly, slip your finger into the corner of your babys mouth to break the suction and place it between your baby's gums. Attempt to start breastfeeding again. Signs of successful breastfeeding Signs from your baby  Your baby will gradually decrease the number of sucks or will completely stop sucking.  Your baby will fall asleep.  Your baby's body will relax.  Your baby will retain a small amount of milk in his or her mouth.  Your baby will let go of your breast by himself or herself.  Signs from you  Breasts that have increased in firmness, weight, and size 1-3 hours after feeding.  Breasts that are softer immediately after breastfeeding.  Increased milk volume, as well as a change in milk consistency and color by the fifth day of breastfeeding.  Nipples that are not sore, cracked, or bleeding.  Signs that your baby is getting enough milk  Wetting at least 1-2 diapers during the first 24 hours after birth.  Wetting at least 5-6 diapers every 24 hours for the first week after birth. The urine should be clear or pale yellow by the age of 5 days.  Wetting 6-8 diapers every 24 hours as your baby continues to grow and develop.  At least 3 stools in a 24-hour period by the age of 5 days. The stool should be soft and yellow.  At least 3 stools in a 24-hour period by the age  of 7 days. The stool should be seedy and yellow.  No loss of weight greater than 10% of birth weight during the first 3 days of life.  Average weight gain of 4-7 oz (113-198 g) per week after the age of 4 days.  Consistent daily weight gain by the age of 5 days, without weight loss after the age of 2 weeks. After a feeding, your baby may spit up a small amount of milk. This is normal. Breastfeeding frequency and duration Frequent feeding will help you make more milk and can prevent sore nipples and extremely full  breasts (breast engorgement). Breastfeed when you feel the need to reduce the fullness of your breasts or when your baby shows signs of hunger. This is called "breastfeeding on demand." Signs that your baby is hungry include:  Increased alertness, activity, or restlessness.  Movement of the head from side to side.  Opening of the mouth when the corner of the mouth or cheek is stroked (rooting).  Increased sucking sounds, smacking lips, cooing, sighing, or squeaking.  Hand-to-mouth movements and sucking on fingers or hands.  Fussing or crying.  Avoid introducing a pacifier to your baby in the first 4-6 weeks after your baby is born. After this time, you may choose to use a pacifier. Research has shown that pacifier use during the first year of a baby's life decreases the risk of sudden infant death syndrome (SIDS). Allow your baby to feed on each breast as long as he or she wants. When your baby unlatches or falls asleep while feeding from the first breast, offer the second breast. Because newborns are often sleepy in the first few weeks of life, you may need to awaken your baby to get him or her to feed. Breastfeeding times will vary from baby to baby. However, the following rules can serve as a guide to help you make sure that your baby is properly fed:  Newborns (babies 31 weeks of age or younger) may breastfeed every 1-3 hours.  Newborns should not go without breastfeeding for longer than 3 hours during the day or 5 hours during the night.  You should breastfeed your baby a minimum of 8 times in a 24-hour period.  Breast milk pumping Pumping and storing breast milk allows you to make sure that your baby is exclusively fed your breast milk, even at times when you are unable to breastfeed. This is especially important if you go back to work while you are still breastfeeding, or if you are not able to be present during feedings. Your lactation consultant can help you find a method of pumping  that works best for you and give you guidelines about how long it is safe to store breast milk. Caring for your breasts while you breastfeed Nipples can become dry, cracked, and sore while breastfeeding. The following recommendations can help keep your breasts moisturized and healthy:  Avoid using soap on your nipples.  Wear a supportive bra designed especially for nursing. Avoid wearing underwire-style bras or extremely tight bras (sports bras).  Air-dry your nipples for 3-4 minutes after each feeding.  Use only cotton bra pads to absorb leaked breast milk. Leaking of breast milk between feedings is normal.  Use lanolin on your nipples after breastfeeding. Lanolin helps to maintain your skin's normal moisture barrier. Pure lanolin is not harmful (not toxic) to your baby. You may also hand express a few drops of breast milk and gently massage that milk into your nipples and allow the  milk to air-dry.  In the first few weeks after giving birth, some women experience breast engorgement. Engorgement can make your breasts feel heavy, warm, and tender to the touch. Engorgement peaks within 3-5 days after you give birth. The following recommendations can help to ease engorgement:  Completely empty your breasts while breastfeeding or pumping. You may want to start by applying warm, moist heat (in the shower or with warm, water-soaked hand towels) just before feeding or pumping. This increases circulation and helps the milk flow. If your baby does not completely empty your breasts while breastfeeding, pump any extra milk after he or she is finished.  Apply ice packs to your breasts immediately after breastfeeding or pumping, unless this is too uncomfortable for you. To do this: ? Put ice in a plastic bag. ? Place a towel between your skin and the bag. ? Leave the ice on for 20 minutes, 2-3 times a day.  Make sure that your baby is latched on and positioned properly while breastfeeding.  If  engorgement persists after 48 hours of following these recommendations, contact your health care provider or a Science writer. Overall health care recommendations while breastfeeding  Eat 3 healthy meals and 3 snacks every day. Well-nourished mothers who are breastfeeding need an additional 450-500 calories a day. You can meet this requirement by increasing the amount of a balanced diet that you eat.  Drink enough water to keep your urine pale yellow or clear.  Rest often, relax, and continue to take your prenatal vitamins to prevent fatigue, stress, and low vitamin and mineral levels in your body (nutrient deficiencies).  Do not use any products that contain nicotine or tobacco, such as cigarettes and e-cigarettes. Your baby may be harmed by chemicals from cigarettes that pass into breast milk and exposure to secondhand smoke. If you need help quitting, ask your health care provider.  Avoid alcohol.  Do not use illegal drugs or marijuana.  Talk with your health care provider before taking any medicines. These include over-the-counter and prescription medicines as well as vitamins and herbal supplements. Some medicines that may be harmful to your baby can pass through breast milk.  It is possible to become pregnant while breastfeeding. If birth control is desired, ask your health care provider about options that will be safe while breastfeeding your baby. Where to find more information: [employer]: www.llli.org Contact a health care provider if:  You feel like you want to stop breastfeeding or have become frustrated with breastfeeding.  Your nipples are cracked or bleeding.  Your breasts are red, tender, or warm.  You have: ? Painful breasts or nipples. ? A swollen area on either breast. ? A fever or chills. ? Nausea or vomiting. ? Drainage other than breast milk from your nipples.  Your breasts do not become full before feedings by the fifth day after you  give birth.  You feel sad and depressed.  Your baby is: ? Too sleepy to eat well. ? Having trouble sleeping. ? More than 35 week old and wetting fewer than 6 diapers in a 24-hour period. ? Not gaining weight by 67 days of age.  Your baby has fewer than 3 stools in a 24-hour period.  Your baby's skin or the white parts of his or her eyes become yellow. Get help right away if:  Your baby is overly tired (lethargic) and does not want to wake up and feed.  Your baby develops an unexplained fever. Summary  Breastfeeding  offers many health benefits for infant and mothers.  Try to breastfeed your infant when he or she shows early signs of hunger.  Gently tickle or stroke your baby's lips with your finger or nipple to allow the baby to open his or her mouth. Bring the baby to your breast. Make sure that much of the areola is in your baby's mouth. Offer one side and burp the baby before you offer the other side.  Talk with your health care provider or lactation consultant if you have questions or you face problems as you breastfeed. This information is not intended to replace advice given to you by your health care provider. Make sure you discuss any questions you have with your health care provider. Document Released: 07/01/2005 Document Revised: 08/02/2016 Document Reviewed: 08/02/2016 Elsevier Interactive Patient Education  [employer].

## 2018-02-07 NOTE — Discharge Summary (Signed)
Obstetric Discharge Summary  Patient ID: Luan MooreSarah C Sasso MRN: [id number] DOB/AGE: 25/03/1991 27 y.o.   Date of Admission: 02/04/2018  Date of Discharge:  02/07/18  Admitting Diagnosis: Onset of Labor at 6722w3d  Secondary Diagnosis: Anemia in pregnancy  Mode of Delivery: normal spontaneous vaginal delivery     Discharge Diagnosis: No other diagnosis   Intrapartum Procedures: Atificial rupture of membranes, epidural and pitocin augmentation   Post partum procedures: None  Complications: First degree perineal laceration, Labial laceration and S/P Manual extraction of placenta   Brief Hospital Course   Luan MooreSarah C Ky is a G2P1001 who had a SVD on 02/05/2018;  for further details of this birth, please refer to the delivey note.  Patient had an uncomplicated postpartum course.  By time of discharge on PPD#2, her pain was controlled on oral pain medications; she had appropriate lochia and was ambulating, voiding without difficulty and tolerating regular diet.  She was deemed stable for discharge to home.      Labs:  CBC Latest Ref Rng & Units 02/06/2018 02/04/2018 01/25/2018  WBC 3.6 - 11.0 K/uL 11.4(H) 12.9(H) 9.3  Hemoglobin 12.0 - 16.0 g/dL 4.4(W9.9(L) 10.213.3 11.9(L)  Hematocrit 35.0 - 47.0 % 29.6(L) 38.6 34.2(L)  Platelets 150 - 440 K/uL 110(L) 159 128(L)   O POS  Physical exam:   Temp:  [98 F (36.7 C)-98.6 F (37 C)] 98.6 F (37 C) (07/27 0800) Pulse Rate:  [58-70] 66 (07/27 0800) Resp:  [18-20] 18 (07/27 0800) BP: (119-143)/(86-96) 143/91 (07/27 0800) SpO2:  [98 %-100 %] 100 % (07/27 0800)  General: alert and no distress  Lochia: appropriate  Abdomen: soft, NT  Uterine Fundus: firm  Perineum: healing well, no significant drainage, no dehiscence, no significant erythema  Extremities: No evidence of DVT seen on physical exam. No lower extremity edema.  Discharge Instructions: Per After Visit Summary.  Activity: Advance as tolerated. Pelvic rest for 6 weeks.  Also refer  to After Visit Summary.  Diet: Regular  Medications: Allergies as of 02/07/2018   No Known Allergies     Medication List    STOP taking these medications   cyclobenzaprine 10 MG tablet Commonly known as:  FLEXERIL   UNISOM 25 MG tablet Generic drug:  doxylamine (Sleep)   zolpidem 5 MG tablet Commonly known as:  AMBIEN     TAKE these medications   acetaminophen 325 MG tablet Commonly known as:  TYLENOL Take 2 tablets (650 mg total) by mouth every 4 (four) hours as needed (for pain scale < 4).   CITRANATAL BLOOM 90-1 MG Tabs Take 1 tablet by mouth daily.   ferrous sulfate 325 (65 FE) MG tablet Take 1 tablet (325 mg total) by mouth 3 (three) times daily with meals.   ibuprofen 600 MG tablet Commonly known as:  ADVIL,MOTRIN Take 1 tablet (600 mg total) by mouth every 6 (six) hours.   magnesium oxide 400 (241.3 Mg) MG tablet Commonly known as:  MAG-OX Take 1 tablet (400 mg total) by mouth daily.   ranitidine 150 MG tablet Commonly known as:  ZANTAC Take 1 tablet (150 mg total) by mouth 2 (two) times daily.      Outpatient follow up:  Follow-up Information    Gunnar BullaLawhorn, Jenkins Michelle, CNM. Call.   Specialties:  Certified Nurse Midwife, Obstetrics and Gynecology, Radiology Why:  Please call to schedule a six (6) week PPV with Serafina RoyalsMichelle Lawhorn, CNM Contact information: [address] [place] [address] [phone]  Postpartum contraception: abstinence   Discharged Condition: stable  Discharged to: home   Newborn Data:  Disposition:home with mother  Apgars: APGAR (1 MIN): 8   APGAR (5 MINS): 9    Baby Feeding: Bottle and Breast   Gunnar Bulla, CNM [facility], [facility] 02/07/18 9:32 AM

## 2018-02-07 NOTE — Clinical Social Work Maternal (Signed)
CLINICAL SOCIAL WORK MATERNAL/CHILD NOTE  Patient Details  Name: Vanessa Gallagher MRN: [id number] Date of Birth: 06-02-1991  Date:  02/07/2018  Clinical Social Worker Initiating Note:  Annamaria Boots LCSWA Date/Time: Initiated:  02/07/18/0859     Child's Name:  Vanessa Gallagher "Woodfin Ganja"   Biological Parents:  Mother, Father   Need for Interpreter:  None   Reason for Referral:  Current Substance Use/Substance Use During Pregnancy    Address:  [place] [address]    Phone number:  [phone] (home)     Additional phone number:   Household Members/Support Persons (HM/SP):       HM/SP Name Relationship DOB or Age  HM/SP -1        HM/SP -2        HM/SP -3        HM/SP -4        HM/SP -5        HM/SP -6        HM/SP -7        HM/SP -8          Natural Supports (not living in the home):  Spouse/significant other   Professional Supports: None   Employment: Unemployed   Type of Work:     Education:  Programmer, systems   Homebound arranged:    Museum/gallery curator Resources:  Medicaid   Other Resources:  [facility]   Cultural/Religious Considerations Which May Impact Care:    Strengths:  Ability to meet basic needs , Home prepared for child , Understanding of illness, Pediatrician chosen   Psychotropic Medications:         Pediatrician:    [employer]  Pediatrician List:   Isabela Ponderay      Pediatrician Fax Number:    Risk Factors/Current Problems:  Substance Use    Cognitive State:  Alert , Insightful , Goal Oriented    Mood/Affect:  Happy , Bright    CSW Assessment: CSW consulted for Substance use during pregnancy. CSW found through chart review that patient tested positive for marijuana use at admission, however baby did not test positive. CSW met with patient regarding substance use. CSW introduced self and explained role. Patient  states that she lives with her boyfriend who is also baby's father. Patient is insightful and in good spirits. Patient states that she has all necessary items for baby at home including, crib, car seat, clothes etc. Patient also states that she has several friends that just had babies that have been helping her. Patient is on Medicaid and plans to have the baby on medicaid as well and possible WIC. Patient states that she has a supportive family that will assist her with baby. CSW educated patient on post partum depression. Patient states that she is very aware of the signs because her friend was just diagnosed. Patient has already talked with her family and friends about the signs of post partum depression and she is working on getting a mental health provider in case she needs it. CSW gave patient resources for mental health in [place]. CSW also spoke with patient about marijuana use. Patient states that she used edible marijuana because of a shoulder injury but she does not use it routinely. CSW explained that baby was negative for marijuana use. CSW educated patient on substance use and to keep substances away from  the baby. Patient states understanding. CSW signing off. Please re consult if further needs arise.   CSW Plan/Description:  No Further Intervention Required/No Barriers to Discharge    Annamaria Boots, [place] 02/07/2018, 9:01 AM

## 2018-02-09 ENCOUNTER — Telehealth: Payer: Self-pay

## 2018-02-09 NOTE — Telephone Encounter (Signed)
[facility] a [facility] to [facility] by [facility]. Message sent to AT.

## 2018-03-05 ENCOUNTER — Encounter: Payer: Self-pay | Admitting: Certified Nurse Midwife

## 2018-03-05 ENCOUNTER — Other Ambulatory Visit: Payer: Self-pay

## 2018-03-05 MED ORDER — LORATADINE 10 MG PO TABS: 10.0000 mg | ORAL_TABLET | Freq: Every day | ORAL | 2 refills | Status: DC

## 2018-03-17 ENCOUNTER — Encounter: Payer: Self-pay | Admitting: Certified Nurse Midwife

## 2019-06-30 ENCOUNTER — Encounter: Payer: Self-pay | Admitting: Emergency Medicine

## 2019-06-30 ENCOUNTER — Ambulatory Visit
Admission: EM | Admit: 2019-06-30 | Discharge: 2019-06-30 | Disposition: A | Discharge status: Home / Self Care | Payer: Medicaid Other

## 2019-06-30 ENCOUNTER — Other Ambulatory Visit: Payer: Self-pay

## 2019-06-30 DIAGNOSIS — R591 Generalized enlarged lymph nodes: Secondary | ICD-10-CM | POA: Diagnosis not present

## 2019-06-30 NOTE — ED Provider Notes (Signed)
[facility]    CSN: [id number] Arrival date & time: 06/30/19  1506      History   Chief Complaint Chief Complaint  Patient presents with  . Lump on left side of neck    HPI Vanessa MooreSarah C Gallagher is a 28 y.o. female.   Patient presents with a "lump" on the left side of her neck x1 day.  She reports it is mildly tender.  She denies fever, chills, earache, sore throat, cough, wounds, rash, other masses or symptoms.  No treatments attempted at home.  The history is provided by the patient.    History reviewed. No pertinent past medical history.  Patient Active Problem List   Diagnosis Date Noted  . First degree perineal laceration 02/07/2018  . Obstetric labial laceration, delivered, current hospitalization 02/07/2018  . Labor and delivery, indication for care 02/04/2018  . Anemia affecting pregnancy 12/10/2017  . 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 Late prenatal care 08/25/2017  . Second trimester pregnancy 08/25/2017    Past Surgical History:  Procedure Laterality Date  . INDUCED ABORTION      OB History    Gravida  2   Para  1   Term  1   Preterm      AB      Living  1     SAB      TAB      Ectopic      Multiple  0   Live Births  1            Home Medications    Prior to Admission medications   Medication Sig Start Date End Date Taking? Authorizing Provider  acetaminophen (TYLENOL) 325 MG tablet Take 2 tablets (650 mg total) by mouth every 4 (four) hours as needed (for pain scale < 4). 02/07/18   Lawhorn, Vanessa DurhamJenkins Michelle, CNM  ferrous sulfate 325 (65 FE) MG tablet Take 1 tablet (325 mg total) by mouth 3 (three) times daily with meals. 02/07/18   Lawhorn, Vanessa DurhamJenkins Michelle, CNM  ibuprofen (ADVIL,MOTRIN) 600 MG tablet Take 1 tablet (600 mg total) by mouth every 6 (six) hours. 02/07/18   Lawhorn, Vanessa DurhamJenkins Michelle, CNM  loratadine (CLARITIN) 10 MG tablet Take 1 tablet (10 mg total) by mouth daily. 03/05/18   Shambley, Melody N, CNM  magnesium oxide  (MAG-OX) 400 (241.3 Mg) MG tablet Take 1 tablet (400 mg total) by mouth daily. 02/07/18   Lawhorn, Vanessa DurhamJenkins Michelle, CNM  Prenatal-DSS-FeCb-FeGl-FA (CITRANATAL BLOOM) 90-1 MG TABS Take 1 tablet by mouth daily. 11/03/17   Shambley, Melody N, CNM  ranitidine (ZANTAC) 150 MG tablet Take 1 tablet (150 mg total) by mouth 2 (two) times daily. Patient not taking: Reported on 02/04/2018 11/25/17   Purcell NailsShambley, Melody N, CNM    Family History No family history on file.  Social History Social History   Tobacco Use  . Smoking status: Current Every Day Smoker    Packs/day: 0.25    Years: 10.00    Pack years: 2.50  . Smokeless tobacco: Never Used  Substance Use Topics  . Alcohol use: Yes    Alcohol/week: 6.0 standard drinks    Types: 6 Cans of beer per week  . Drug use: No     Allergies   Patient has no known allergies.   Review of Systems Review of Systems  Constitutional: Negative for chills, fatigue and fever.  HENT: Negative for congestion, ear pain, rhinorrhea, sore throat and trouble swallowing.   Eyes: Negative for pain and  visual disturbance.  Respiratory: Negative for cough and shortness of breath.   Cardiovascular: Negative for chest pain and palpitations.  Gastrointestinal: Negative for abdominal pain, diarrhea and vomiting.  Genitourinary: Negative for dysuria and hematuria.  Musculoskeletal: Negative for arthralgias and back pain.  Skin: Negative for color change, rash and wound.  Neurological: Negative for seizures and syncope.  All other systems reviewed and are negative.    Physical Exam Triage Vital Signs ED Triage Vitals  Enc Vitals Group     BP      Pulse      Resp      Temp      Temp src      SpO2      Weight      Height      Head Circumference      Peak Flow      Pain Score      Pain Loc      Pain Edu?      Excl. in GC?    No data found.  Updated Vital Signs BP 111/80   Pulse 83   Temp 99.7 F (37.6 C)   Resp 18   Wt 115 lb (52.2 kg)   LMP   (LMP Unknown)   SpO2 97%   BMI 19.74 kg/m   Visual Acuity Right Eye Distance:   Left Eye Distance:   Bilateral Distance:    Right Eye Near:   Left Eye Near:    Bilateral Near:     Physical Exam Vitals and nursing note reviewed.  Constitutional:      General: She is not in acute distress.    Appearance: She is well-developed. She is not ill-appearing.  HENT:     Head: Normocephalic and atraumatic.     Right Ear: Tympanic membrane normal.     Left Ear: Tympanic membrane normal.     Nose: Nose normal.     Mouth/Throat:     Mouth: Mucous membranes are moist.     Pharynx: Oropharynx is clear.  Eyes:     Conjunctiva/sclera: Conjunctivae normal.  Neck:     Comments: One left cervical lymph node enlarged approximately 1 cm, mildly tender, mobile, soft.  Cardiovascular:     Rate and Rhythm: Normal rate and regular rhythm.     Heart sounds: No murmur.  Pulmonary:     Effort: Pulmonary effort is normal. No respiratory distress.     Breath sounds: Normal breath sounds.  Abdominal:     General: Bowel sounds are normal.     Palpations: Abdomen is soft.     Tenderness: There is no abdominal tenderness. There is no guarding or rebound.  Musculoskeletal:     Cervical back: Neck supple.  Lymphadenopathy:     Cervical: Cervical adenopathy present.  Skin:    General: Skin is warm and dry.     Findings: No rash.  Neurological:     General: No focal deficit present.     Mental Status: She is alert and oriented to person, place, and time.  Psychiatric:        Mood and Affect: Mood normal.        Behavior: Behavior normal.      UC Treatments / Results  Labs (all labs ordered are listed, but only abnormal results are displayed) Labs Reviewed - No data to display  EKG   Radiology No results found.  Procedures Procedures (including critical care time)  Medications Ordered in UC Medications - No data  to display  Initial Impression / Assessment and Plan / UC Course  I  have reviewed the triage vital signs and the nursing notes.  Pertinent labs & imaging results that were available during my care of the patient were reviewed by me and considered in my medical decision making (see chart for details).    Lymphadenopathy.  Patient is well-appearing.  Instructed patient to take Tylenol as needed for discomfort.  Discussed that most lymphadenopathy will resolve on its own within 2 weeks.  Discussed that if her lymph node is not resolving that she should call the surgeon listed for an appointment for evaluation.  Patient agrees to plan of care.     Final Clinical Impressions(s) / UC Diagnoses   Final diagnoses:  Lymphadenopathy     Discharge Instructions     Take Tylenol as needed for discomfort.    Call to schedule an appointment with the surgeon listed below if your lymph node is still enlarged in 2 weeks.      ED Prescriptions    None     PDMP not reviewed this encounter.   Sharion Balloon, NP 06/30/19 [phone]

## 2019-06-30 NOTE — ED Notes (Signed)
Patient in office today c/o knot on left side of neck that she notice yesterday.

## 2019-06-30 NOTE — Discharge Instructions (Signed)
Take Tylenol as needed for discomfort.    Call to schedule an appointment with the surgeon listed below if your lymph node is still enlarged in 2 weeks.

## 2019-08-24 ENCOUNTER — Encounter: Payer: Self-pay | Admitting: Emergency Medicine

## 2019-08-24 ENCOUNTER — Ambulatory Visit
Admission: EM | Admit: 2019-08-24 | Discharge: 2019-08-24 | Disposition: A | Discharge status: Home / Self Care | Payer: Medicaid Other | Attending: Emergency Medicine | Admitting: Emergency Medicine

## 2019-08-24 ENCOUNTER — Other Ambulatory Visit: Payer: Self-pay

## 2019-08-24 DIAGNOSIS — Z207 Contact with and (suspected) exposure to pediculosis, acariasis and other infestations: Secondary | ICD-10-CM

## 2019-08-24 DIAGNOSIS — L210 Seborrhea capitis: Secondary | ICD-10-CM

## 2019-08-24 MED ORDER — PERMETHRIN 1 % EX LIQD: Freq: Once | CUTANEOUS | 0 refills | Status: AC

## 2019-08-24 NOTE — ED Provider Notes (Signed)
[name]    CSN: [id number] Arrival date & time: 08/24/19  1802      History   Chief Complaint Chief Complaint  Patient presents with  . Lice Exposure    HPI Vanessa Gallagher is a 29 y.o. female presenting for assessment after being exposed to lice by her goddaughter last week.  Last known contact was around Friday.  Patient does have history of dandruff and a "picking issue" of her scalp, so she is unsure if she has any lice.  Has been using lice comb daily, cleaned all her linens and bedding, vacuumed carpets.  Nobody else at home has symptoms.   History reviewed. No pertinent past medical history.  Patient Active Problem List   Diagnosis Date Noted  . First degree perineal laceration 02/07/2018  . Obstetric labial laceration, delivered, current hospitalization 02/07/2018  . Labor and delivery, indication for care 02/04/2018  . Anemia affecting pregnancy 12/10/2017  . 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 Late prenatal care 08/25/2017  . Second trimester pregnancy 08/25/2017    Past Surgical History:  Procedure Laterality Date  . INDUCED ABORTION      OB History    Gravida  2   Para  1   Term  1   Preterm      AB      Living  1     SAB      TAB      Ectopic      Multiple  0   Live Births  1            Home Medications    Prior to Admission medications   Medication Sig Start Date End Date Taking? Authorizing Provider  acetaminophen (TYLENOL) 325 MG tablet Take 2 tablets (650 mg total) by mouth every 4 (four) hours as needed (for pain scale < 4). 02/07/18   Lawhorn, Lara Mulch, CNM  permethrin (NIX) 1 % liquid Apply topically once for 1 dose. Leave in hair for 10 minutes, may repeat in 9 days 08/24/19 08/24/19  Hall-Potvin, [place], PA-C  Prenatal-DSS-FeCb-FeGl-FA (CITRANATAL BLOOM) 90-1 MG TABS Take 1 tablet by mouth daily. 11/03/17   Shambley, Melody N, CNM  ferrous sulfate 325 (65 FE) MG tablet Take 1 tablet (325 mg total) by mouth 3  (three) times daily with meals. 02/07/18 08/24/19  Diona Fanti, CNM  loratadine (CLARITIN) 10 MG tablet Take 1 tablet (10 mg total) by mouth daily. 03/05/18 08/24/19  Shambley, Melody N, CNM  ranitidine (ZANTAC) 150 MG tablet Take 1 tablet (150 mg total) by mouth 2 (two) times daily. Patient not taking: Reported on 02/04/2018 11/25/17 08/24/19  Joylene Igo, CNM    Family History Family History  Problem Relation Age of Onset  . COPD Father     Social History Social History   Tobacco Use  . Smoking status: Current Every Day Smoker    Packs/day: 0.25    Years: 10.00    Pack years: 2.50    Types: E-cigarettes  . Smokeless tobacco: Never Used  Substance Use Topics  . Alcohol use: Yes    Alcohol/week: 6.0 standard drinks    Types: 6 Cans of beer per week  . Drug use: No     Allergies   Patient has no known allergies.   Review of Systems As per HPI   Physical Exam Triage Vital Signs ED Triage Vitals  Enc Vitals Group     BP      Pulse  Resp      Temp      Temp src      SpO2      Weight      Height      Head Circumference      Peak Flow      Pain Score      Pain Loc      Pain Edu?      Excl. in GC?    No data found.  Updated Vital Signs BP 115/74 (BP Location: Left Arm)   Pulse 83   Temp 97.7 F (36.5 C) (Temporal)   Resp 18   LMP 08/20/2019   SpO2 99%   Visual Acuity Right Eye Distance:   Left Eye Distance:   Bilateral Distance:    Right Eye Near:   Left Eye Near:    Bilateral Near:     Physical Exam Constitutional:      General: She is not in acute distress. HENT:     Head: Normocephalic and atraumatic.  Eyes:     General: No scleral icterus.    Pupils: Pupils are equal, round, and reactive to light.  Cardiovascular:     Rate and Rhythm: Normal rate.  Pulmonary:     Effort: Pulmonary effort is normal.  Skin:    Coloration: Skin is not jaundiced or pale.     Comments: Mild dandruff noted: No louse bites, nits, lice  observed  Neurological:     Mental Status: She is alert and oriented to person, place, and time.      UC Treatments / Results  Labs (all labs ordered are listed, but only abnormal results are displayed) Labs Reviewed - No data to display  EKG   Radiology No results found.  Procedures Procedures (including critical care time)  Medications Ordered in UC Medications - No data to display  Initial Impression / Assessment and Plan / UC Course  I have reviewed the triage vital signs and the nursing notes.  Pertinent labs & imaging results that were available during my care of the patient were reviewed by me and considered in my medical decision making (see chart for details).     No lice, bites, nits observed on exam today.  Discussed patient can use Selsun Blue to help with dandruff.  Agreeable to sending in Nix solution should patient find lice on her or family members over the next few days.  Return precautions discussed, patient verbalized understanding and is agreeable to plan. Final Clinical Impressions(s) / UC Diagnoses   Final diagnoses:  Exposure to pediculosis capitis  Dandruff in adult     Discharge Instructions     Dandruff is mild: New Selsun Blue Alleviate symptoms. May use permethrin if you do find lice/nits over the next few weeks: None were observed today.    ED Prescriptions    Medication Sig Dispense Auth. Provider   permethrin (NIX) 1 % liquid Apply topically once for 1 dose. Leave in hair for 10 minutes, may repeat in 9 days 118 mL Hall-Potvin, [place], PA-C     PDMP not reviewed this encounter.   Hall-Potvin, [place], [place] 08/24/19 1832

## 2019-08-24 NOTE — ED Triage Notes (Signed)
Pt presents to [facility] for assessment after being exposed to lice sometime last week (possibly Friday) and wants to be checked.

## 2019-08-24 NOTE — ED Notes (Signed)
Patient able to ambulate independently  

## 2019-08-24 NOTE — Discharge Instructions (Signed)
Dandruff is mild: New Selsun Blue Alleviate symptoms. May use permethrin if you do find lice/nits over the next few weeks: None were observed today.

## 2020-03-29 ENCOUNTER — Telehealth: Payer: Medicaid Other | Admitting: Nurse Practitioner

## 2020-03-29 DIAGNOSIS — B9789 Other viral agents as the cause of diseases classified elsewhere: Secondary | ICD-10-CM

## 2020-03-29 DIAGNOSIS — J329 Chronic sinusitis, unspecified: Secondary | ICD-10-CM

## 2020-03-29 MED ORDER — FLUTICASONE PROPIONATE 50 MCG/ACT NA SUSP: 2.0000 | Freq: Every day | NASAL | 6 refills | Status: DC

## 2020-03-29 NOTE — Progress Notes (Signed)

## 2020-04-06 ENCOUNTER — Telehealth: Payer: Medicaid Other | Admitting: Emergency Medicine

## 2020-04-06 DIAGNOSIS — J329 Chronic sinusitis, unspecified: Secondary | ICD-10-CM | POA: Diagnosis not present

## 2020-04-06 MED ORDER — AMOXICILLIN-POT CLAVULANATE 875-125 MG PO TABS: 1.0000 | ORAL_TABLET | Freq: Two times a day (BID) | ORAL | 0 refills | Status: DC

## 2020-04-06 NOTE — Progress Notes (Signed)

## 2020-04-07 ENCOUNTER — Other Ambulatory Visit: Payer: Self-pay

## 2020-04-07 ENCOUNTER — Ambulatory Visit
Admission: EM | Admit: 2020-04-07 | Discharge: 2020-04-07 | Disposition: A | Discharge status: Home / Self Care | Payer: Medicaid Other | Attending: Emergency Medicine | Admitting: Emergency Medicine

## 2020-04-07 DIAGNOSIS — Z1152 Encounter for screening for COVID-19: Secondary | ICD-10-CM

## 2020-04-07 DIAGNOSIS — J069 Acute upper respiratory infection, unspecified: Secondary | ICD-10-CM | POA: Diagnosis not present

## 2020-04-07 MED ORDER — BENZONATATE 100 MG PO CAPS: 100.0000 mg | ORAL_CAPSULE | Freq: Three times a day (TID) | ORAL | 0 refills | Status: DC

## 2020-04-07 NOTE — ED Provider Notes (Signed)
[name]    CSN: [id number] Arrival date & time: 04/07/20  1533      History   Chief Complaint Chief Complaint  Patient presents with  . Headache    x 1 week  . Facial Pain    x 1 week  . Ear Fullness    x 1 week  . Sore Throat    x 1 week    HPI Vanessa Gallagher is a 29 y.o. female  Presenting for weeklong course of URI.  Endorsing nasal congestion, sinus pain, headache, sore throat.  Noting productive cough with mucus.  Was seen via E visit x2 last week: Given antibiotics, which she has been compliant since yesterday with some improvement.  Denies chest pain, fever, difficulty breathing.  History reviewed. No pertinent past medical history.  Patient Active Problem List   Diagnosis Date Noted  . First degree perineal laceration 02/07/2018  . Obstetric labial laceration, delivered, current hospitalization 02/07/2018  . Labor and delivery, indication for care 02/04/2018  . Anemia affecting pregnancy 12/10/2017  . 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 Late prenatal care 08/25/2017  . Second trimester pregnancy 08/25/2017    Past Surgical History:  Procedure Laterality Date  . INDUCED ABORTION      OB History    Gravida  2   Para  1   Term  1   Preterm      AB      Living  1     SAB      TAB      Ectopic      Multiple  0   Live Births  1            Home Medications    Prior to Admission medications   Medication Sig Start Date End Date Taking? Authorizing Provider  acetaminophen (TYLENOL) 325 MG tablet Take 2 tablets (650 mg total) by mouth every 4 (four) hours as needed (for pain scale < 4). 02/07/18  Yes Lawhorn, Vanessa [place], CNM  amoxicillin-clavulanate (AUGMENTIN) 875-125 MG tablet Take 1 tablet by mouth every 12 (twelve) hours. 04/06/20  Yes Roxy Horseman, PA-C  fluticasone (FLONASE) 50 MCG/ACT nasal spray Place 2 sprays into both nostrils daily. 03/29/20  Yes Martin, Mary-Margaret, FNP  benzonatate (TESSALON) 100 MG capsule  Take 1 capsule (100 mg total) by mouth every 8 (eight) hours. 04/07/20   Hall-Potvin, [place], PA-C  ferrous sulfate 325 (65 FE) MG tablet Take 1 tablet (325 mg total) by mouth 3 (three) times daily with meals. 02/07/18 08/24/19  Gunnar Bulla, CNM  loratadine (CLARITIN) 10 MG tablet Take 1 tablet (10 mg total) by mouth daily. 03/05/18 08/24/19  Shambley, Melody N, CNM  ranitidine (ZANTAC) 150 MG tablet Take 1 tablet (150 mg total) by mouth 2 (two) times daily. Patient not taking: Reported on 02/04/2018 11/25/17 08/24/19  Purcell Nails, CNM    Family History Family History  Problem Relation Age of Onset  . COPD Father     Social History Social History   Tobacco Use  . Smoking status: Current Every Day Smoker    Packs/day: 0.25    Years: 10.00    Pack years: 2.50    Types: E-cigarettes  . Smokeless tobacco: Never Used  Vaping Use  . Vaping Use: Some days  Substance Use Topics  . Alcohol use: Yes    Alcohol/week: 6.0 standard drinks    Types: 6 Cans of beer per week  . Drug use: No  Allergies   Patient has no known allergies.   Review of Systems As per HPI   Physical Exam Triage Vital Signs ED Triage Vitals  Enc Vitals Group     BP 04/07/20 1657 104/72     Pulse Rate 04/07/20 1657 83     Resp 04/07/20 1657 18     Temp 04/07/20 1657 98.3 F (36.8 C)     Temp Source 04/07/20 1657 Oral     SpO2 04/07/20 1657 100 %     Weight --      Height --      Head Circumference --      Peak Flow --      Pain Score 04/07/20 1659 5     Pain Loc --      Pain Edu? --      Excl. in GC? --    No data found.  Updated Vital Signs BP 104/72 (BP Location: Left Arm)   Pulse 83   Temp 98.3 F (36.8 C) (Oral)   Resp 18   LMP 03/31/2020 (Exact Date)   SpO2 100%   Visual Acuity Right Eye Distance:   Left Eye Distance:   Bilateral Distance:    Right Eye Near:   Left Eye Near:    Bilateral Near:     Physical Exam Constitutional:      General: She is not in  acute distress.    Appearance: She is obese. She is not ill-appearing or diaphoretic.  HENT:     Head: Normocephalic and atraumatic.     Mouth/Throat:     Mouth: Mucous membranes are moist.     Pharynx: Oropharynx is clear. No oropharyngeal exudate or posterior oropharyngeal erythema.  Eyes:     General: No scleral icterus.    Conjunctiva/sclera: Conjunctivae normal.     Pupils: Pupils are equal, round, and reactive to light.  Neck:     Comments: Trachea midline, negative JVD Cardiovascular:     Rate and Rhythm: Normal rate and regular rhythm.     Heart sounds: No murmur heard.  No gallop.   Pulmonary:     Effort: Pulmonary effort is normal. No respiratory distress.     Breath sounds: No wheezing, rhonchi or rales.  Musculoskeletal:     Cervical back: Neck supple. No tenderness.  Lymphadenopathy:     Cervical: No cervical adenopathy.  Skin:    Capillary Refill: Capillary refill takes less than 2 seconds.     Coloration: Skin is not jaundiced or pale.     Findings: No rash.  Neurological:     General: No focal deficit present.     Mental Status: She is alert and oriented to person, place, and time.      UC Treatments / Results  Labs (all labs ordered are listed, but only abnormal results are displayed) Labs Reviewed  NOVEL CORONAVIRUS, NAA    EKG   Radiology No results found.  Procedures Procedures (including critical care time)  Medications Ordered in UC Medications - No data to display  Initial Impression / Assessment and Plan / UC Course  I have reviewed the triage vital signs and the nursing notes.  Pertinent labs & imaging results that were available during my care of the patient were reviewed by me and considered in my medical decision making (see chart for details).     Patient afebrile, nontoxic, with SpO2 100%.  Covid PCR pending.  Patient to quarantine until results are back.  We will treat supportively  as outlined below.  Return precautions  discussed, patient verbalized understanding and is agreeable to plan. Final Clinical Impressions(s) / UC Diagnoses   Final diagnoses:  Encounter for screening for COVID-19  URI with cough and congestion     Discharge Instructions     Tessalon for cough. Start flonase, atrovent nasal spray for nasal congestion/drainage. You can use over the counter nasal saline rinse such as neti pot for nasal congestion. Keep hydrated, your urine should be clear to pale yellow in color. Tylenol/motrin for fever and pain. Monitor for any worsening of symptoms, chest pain, shortness of breath, wheezing, swelling of the throat, go to the emergency department for further evaluation needed.     ED Prescriptions    Medication Sig Dispense Auth. Provider   benzonatate (TESSALON) 100 MG capsule Take 1 capsule (100 mg total) by mouth every 8 (eight) hours. 21 capsule Hall-Potvin, [place], PA-C     PDMP not reviewed this encounter.   Odette Fraction [place], [place] 04/07/20 1746

## 2020-04-07 NOTE — Discharge Instructions (Addendum)

## 2020-04-07 NOTE — ED Triage Notes (Signed)
Pt states she had nasal congestion, sinus pain, headache, and sore throat x 1 week. Also productive cough with mucus. Pt is aox4 and ambulatory.

## 2020-04-08 LAB — SARS-COV-2, NAA 2 DAY TAT

## 2020-04-08 LAB — NOVEL CORONAVIRUS, NAA: SARS-CoV-2, NAA: DETECTED — AB

## 2020-04-09 ENCOUNTER — Telehealth: Payer: Self-pay | Admitting: Physician Assistant

## 2020-04-09 NOTE — Telephone Encounter (Signed)
Called to Discuss with patient about Covid symptoms and the use of the monoclonal antibody infusion for those with mild to moderate Covid symptoms and at a high risk of hospitalization.     Pt appears to qualify for this infusion due to co-morbid conditions and/or a member of an at-risk group in accordance with the FDA Emergency Use Authorization.    Unable to reach pt, left a VM and sent MyChart message. Pt qualifies with high SVI/risk group 4.  Roe Rutherford [place] Skalicky, PA-C 04/09/2020, 10:42 AM

## 2020-05-01 ENCOUNTER — Encounter: Payer: Self-pay | Admitting: Internal Medicine

## 2020-05-01 ENCOUNTER — Telehealth (INDEPENDENT_AMBULATORY_CARE_PROVIDER_SITE_OTHER): Payer: Medicaid Other | Admitting: Internal Medicine

## 2020-05-01 DIAGNOSIS — Z7689 Persons encountering health services in other specified circumstances: Secondary | ICD-10-CM

## 2020-05-01 DIAGNOSIS — U071 COVID-19: Secondary | ICD-10-CM

## 2020-05-01 NOTE — Progress Notes (Signed)
Virtual Visit via Telephone Note  I connected with Vanessa Gallagher, on 05/01/2020 at 9:14 AM by telephone due to the COVID-19 pandemic and verified that I am speaking with the correct person using two identifiers.   Consent: I discussed the limitations, risks, security and privacy concerns of performing an evaluation and management service by telephone and the availability of in person appointments. I also discussed with the patient that there may be a patient responsible charge related to this service. The patient expressed understanding and agreed to proceed.   Location of Patient: Home   Location of Provider: Clinic    Persons participating in Telemedicine visit: Vanessa Gallagher [facility] Dr. Earlene Plater      History of Present Illness: Patient has a visit to f/u on +COVID test. First had symptoms around 9/17. She had sinus headache. She had a fever from 9/20-9/22.  Her son became symptomatic and tested positive which prompted her to get tested as well. Positive test was 9/24. She lost her sense of taste and smell and her throat was sore. All symptoms have now resolved.   She had first vaccine on 8/30 with Moderna. She has not completed vaccination series because she got sick at the time that she was due.    No past medical history on file. No Known Allergies  Current Outpatient Medications on File Prior to Visit  Medication Sig Dispense Refill  . [DISCONTINUED] ferrous sulfate 325 (65 FE) MG tablet Take 1 tablet (325 mg total) by mouth 3 (three) times daily with meals. 90 tablet 3  . [DISCONTINUED] loratadine (CLARITIN) 10 MG tablet Take 1 tablet (10 mg total) by mouth daily. 30 tablet 2  . [DISCONTINUED] ranitidine (ZANTAC) 150 MG tablet Take 1 tablet (150 mg total) by mouth 2 (two) times daily. (Patient not taking: Reported on 02/04/2018) 60 tablet 4   No current facility-administered medications on file prior to visit.    Observations/Objective: NAD. Speaking clearly.   Work of breathing normal.  Alert and oriented. Mood appropriate.   Assessment and Plan: 1. Encounter to establish care Reviewed patient's PMH, social history, surgical history, and medications.  Is overdue for annual exam, screening blood work, and health maintenance topics. Have asked patient to return for visit to address these items.   2. COVID-19 virus infection Asymptomatic, recovered. Discussed completing COVID vaccination series. She is outside of quarantine window but encouraged to still mask and social distance especially around unvaccinated populations or those with chronic underlying health conditions. Discussed that repeat testing at this point would be of low utility as would likely still show as positive.      Follow Up Instructions: Annual exam with PAP    I discussed the assessment and treatment plan with the patient. The patient was provided an opportunity to ask questions and all were answered. The patient agreed with the plan and demonstrated an understanding of the instructions.   The patient was advised to call back or seek an in-person evaluation if the symptoms worsen or if the condition fails to improve as anticipated.     I provided 14 minutes total of non-face-to-face time during this encounter including median intraservice time, reviewing previous notes, investigations, ordering medications, medical decision making, coordinating care and patient verbalized understanding at the end of the visit.    Marcy Siren, D.O. Primary Care at [facility]  05/01/2020, 9:14 AM

## 2020-05-01 NOTE — Patient Instructions (Signed)
Thank you for choosing Primary Care at [facility] to be your medical home!    Vanessa Gallagher was seen by De Hollingshead, DO today.   Vanessa Gallagher's primary care provider is Marcy Siren, DO.   For the best care possible, you should try to see Marcy Siren, DO whenever you come to the clinic.   We look forward to seeing you again soon!  If you have any questions about your visit today, please call us at [phone] or feel free to reach your primary care provider via MyChart.

## 2020-06-20 ENCOUNTER — Encounter: Payer: Medicaid Other | Admitting: Internal Medicine

## 2020-07-14 ENCOUNTER — Encounter: Payer: Self-pay | Admitting: Internal Medicine

## 2020-07-14 ENCOUNTER — Other Ambulatory Visit (HOSPITAL_COMMUNITY)
Admission: RE | Admit: 2020-07-14 | Discharge: 2020-07-14 | Disposition: A | Discharge status: Home / Self Care | Payer: Medicaid Other | Source: Ambulatory Visit | Attending: Internal Medicine | Admitting: Internal Medicine

## 2020-07-14 ENCOUNTER — Other Ambulatory Visit: Payer: Self-pay

## 2020-07-14 ENCOUNTER — Ambulatory Visit (INDEPENDENT_AMBULATORY_CARE_PROVIDER_SITE_OTHER): Payer: Medicaid Other | Admitting: Internal Medicine

## 2020-07-14 VITALS — BP 109/77 | HR 83 | Temp 97.3°F | Ht 64.45 in | Wt 114.0 lb

## 2020-07-14 DIAGNOSIS — Z0000 Encounter for general adult medical examination without abnormal findings: Secondary | ICD-10-CM

## 2020-07-14 DIAGNOSIS — Z124 Encounter for screening for malignant neoplasm of cervix: Secondary | ICD-10-CM | POA: Diagnosis not present

## 2020-07-14 DIAGNOSIS — Z1159 Encounter for screening for other viral diseases: Secondary | ICD-10-CM

## 2020-07-14 DIAGNOSIS — Z13228 Encounter for screening for other metabolic disorders: Secondary | ICD-10-CM

## 2020-07-14 DIAGNOSIS — Z130 Encounter for screening for diseases of the blood and blood-forming organs and certain disorders involving the immune mechanism: Secondary | ICD-10-CM

## 2020-07-14 DIAGNOSIS — Z113 Encounter for screening for infections with a predominantly sexual mode of transmission: Secondary | ICD-10-CM | POA: Insufficient documentation

## 2020-07-14 DIAGNOSIS — N63 Unspecified lump in unspecified breast: Secondary | ICD-10-CM

## 2020-07-14 DIAGNOSIS — F909 Attention-deficit hyperactivity disorder, unspecified type: Secondary | ICD-10-CM

## 2020-07-14 NOTE — Progress Notes (Signed)
Subjective:    Vanessa Gallagher - 29 y.o. female MRN [id number]  Date of birth: 27-Aug-1990  HPI  Vanessa Gallagher is here for annual exam. Has concern about right breast lump. Asks for STD screening. Has a history of ADHD and would like to be evaluated as feels she is suffering from symptoms as an adult.    Depression screen [facility] 2/9 07/14/2020 05/01/2020  Decreased Interest 0 0  Down, Depressed, Hopeless 0 0  PHQ - 2 Score 0 0    Health Maintenance:  Health Maintenance Due  Topic Date Due  . INFLUENZA VACCINE  02/13/2020  . COVID-19 Vaccine (2 - Moderna 3-dose series) 04/10/2020    -  reports that she has been smoking e-cigarettes. She has a 2.50 pack-year smoking history. She has never used smokeless tobacco. - Review of Systems: Per HPI. - Past Medical History: There are no problems to display for this patient.  - Medications: reviewed and updated   Objective:   Physical Exam BP 109/77 (BP Location: Left Arm, Patient Position: Sitting)   Pulse 83   Temp (!) 97.3 F (36.3 C)   Ht 5' 4.45" (1.637 m)   Wt 114 lb (51.7 kg)   SpO2 98%   BMI 19.30 kg/m  Physical Exam Constitutional:      Appearance: She is not diaphoretic.  HENT:     Head: Normocephalic and atraumatic.     Mouth/Throat:     Mouth: Oropharynx is clear and moist.      Comments: TMs normal bilaterally Eyes:     Extraocular Movements: EOM normal.     Conjunctiva/sclera: Conjunctivae normal.     Pupils: Pupils are equal, round, and reactive to light.  Neck:     Thyroid: No thyromegaly.  Cardiovascular:     Rate and Rhythm: Normal rate and regular rhythm.     Pulses: Intact distal pulses.     Heart sounds: Normal heart sounds. No murmur heard.   Pulmonary:     Effort: Pulmonary effort is normal. No respiratory distress.     Breath sounds: Normal breath sounds. No wheezing.  Abdominal:     General: Bowel sounds are normal. There is no distension.     Palpations: Abdomen is soft.     Tenderness: There  is no abdominal tenderness. There is no guarding or rebound.  Genitourinary:    Comments: GU/GYN: Exam performed in the presence of a chaperone. External genitalia within normal limits.  Vaginal mucosa pink, moist, normal rugae.  Nonfriable cervix without lesions, no discharge or bleeding noted on speculum exam.  Bimanual exam revealed normal, nongravid uterus.  No cervical motion tenderness. No adnexal masses bilaterally.    Musculoskeletal:        General: No deformity or edema. Normal range of motion.     Cervical back: Normal range of motion and neck supple.  Lymphadenopathy:     Cervical: No cervical adenopathy.  Skin:    General: Skin is warm and dry.     Findings: No rash.  Neurological:     Mental Status: She is alert and oriented to person, place, and time.     Gait: Gait is intact.  Psychiatric:        Mood and Affect: Mood and affect normal.        Judgment: Judgment normal.            Assessment & Plan:   1. Encounter for annual physical exam Counseled on 150 minutes of exercise  per week, healthy eating (including decreased daily intake of saturated fats, cholesterol, added sugars, sodium), STI prevention, routine healthcare maintenance.  2. Screening for cervical cancer - Cytology - PAP([facility])  3. Breast lump - MM DIAG BREAST TOMO UNI RIGHT; Future  4. Screening for metabolic disorder - Comprehensive metabolic panel - Lipid panel  5. Screening for deficiency anemia - CBC  6. Need for hepatitis C screening test - Hepatitis C antibody  7. Screening for STD (sexually transmitted disease) - HIV Antibody (routine testing w rflx) - RPR - Cervicovaginal ancillary only  8. Attention deficit hyperactivity disorder (ADHD), unspecified ADHD type - Ambulatory referral to Psychology   Marcy Siren, D.O. 07/27/2020, 9:57 PM Primary Care at [facility]

## 2020-07-14 NOTE — Progress Notes (Signed)
Pt has lump on right breast since 29yrs old Has lower neck pain that radiates to shoulders Did not receive 2nd dose of covid vaccine wants to know does she start over Wants flu shot

## 2020-07-17 LAB — CERVICOVAGINAL ANCILLARY ONLY
Bacterial Vaginitis (gardnerella): NEGATIVE
Candida Glabrata: NEGATIVE
Candida Vaginitis: NEGATIVE
Chlamydia: NEGATIVE
Comment: NEGATIVE
Comment: NEGATIVE
Comment: NEGATIVE
Comment: NEGATIVE
Comment: NEGATIVE
Comment: NORMAL
Neisseria Gonorrhea: NEGATIVE
Trichomonas: NEGATIVE

## 2020-07-18 NOTE — Progress Notes (Signed)
Normal result letter generated and sent via MyChart. 

## 2020-07-21 ENCOUNTER — Other Ambulatory Visit: Payer: Medicaid Other

## 2020-07-21 ENCOUNTER — Other Ambulatory Visit: Payer: Self-pay

## 2020-07-22 LAB — COMPREHENSIVE METABOLIC PANEL
ALT: 14 IU/L (ref 0–32)
AST: 26 IU/L (ref 0–40)
Albumin/Globulin Ratio: 1.8 (ref 1.2–2.2)
Albumin: 5 g/dL (ref 3.9–5.0)
Alkaline Phosphatase: 59 IU/L (ref 44–121)
BUN/Creatinine Ratio: 13 (ref 9–23)
BUN: 12 mg/dL (ref 6–20)
Bilirubin Total: 1.2 mg/dL (ref 0.0–1.2)
CO2: 25 mmol/L (ref 20–29)
Calcium: 9.7 mg/dL (ref 8.7–10.2)
Chloride: 99 mmol/L (ref 96–106)
Creatinine, Ser: 0.91 mg/dL (ref 0.57–1.00)
GFR calc Af Amer: 100 mL/min/{1.73_m2} (ref >59)
GFR calc non Af Amer: 87 mL/min/{1.73_m2} (ref >59)
Globulin, Total: 2.8 g/dL (ref 1.5–4.5)
Glucose: 112 mg/dL — ABNORMAL HIGH (ref 65–99)
Potassium: 3.3 mmol/L — ABNORMAL LOW (ref 3.5–5.2)
Sodium: 137 mmol/L (ref 134–144)
Total Protein: 7.8 g/dL (ref 6.0–8.5)

## 2020-07-22 LAB — CBC
Hematocrit: 39.5 % (ref 34.0–46.6)
Hemoglobin: 13.4 g/dL (ref 11.1–15.9)
MCH: 30.9 pg (ref 26.6–33.0)
MCHC: 33.9 g/dL (ref 31.5–35.7)
MCV: 91 fL (ref 79–97)
Platelets: 196 10*3/uL (ref 150–450)
RBC: 4.34 x10E6/uL (ref 3.77–5.28)
RDW: 12 % (ref 11.7–15.4)
WBC: 5.5 10*3/uL (ref 3.4–10.8)

## 2020-07-22 LAB — LIPID PANEL
Chol/HDL Ratio: 2.9 ratio (ref 0.0–4.4)
Cholesterol, Total: 180 mg/dL (ref 100–199)
HDL: 62 mg/dL (ref >39)
LDL Chol Calc (NIH): 99 mg/dL (ref 0–99)
Triglycerides: 104 mg/dL (ref 0–149)
VLDL Cholesterol Cal: 19 mg/dL (ref 5–40)

## 2020-07-22 LAB — RPR: RPR Ser Ql: NONREACTIVE

## 2020-07-22 LAB — HIV ANTIBODY (ROUTINE TESTING W REFLEX): HIV Screen 4th Generation wRfx: NONREACTIVE

## 2020-07-22 LAB — HEPATITIS C ANTIBODY: Hep C Virus Ab: 0.3 s/co ratio (ref 0.0–0.9)

## 2020-07-23 LAB — CYTOLOGY - PAP: Diagnosis: NEGATIVE

## 2020-07-24 ENCOUNTER — Telehealth: Payer: Self-pay | Admitting: Internal Medicine

## 2020-07-24 NOTE — Telephone Encounter (Signed)
Pt aware of results and voiced understanding, all questions answered/concerns addressed.  

## 2020-07-24 NOTE — Telephone Encounter (Signed)
Patient called in concerned about her glucose levels from recent lab results. Would like nurse/provider to call her back regarding concerns. Please advise.

## 2020-07-24 NOTE — Telephone Encounter (Signed)
ATC pt to inform of normal results and discuss potassium rich diet, no answer, LM to [facility]

## 2020-09-04 ENCOUNTER — Other Ambulatory Visit: Payer: Self-pay

## 2020-09-04 ENCOUNTER — Ambulatory Visit
Admission: RE | Admit: 2020-09-04 | Discharge: 2020-09-04 | Disposition: A | Discharge status: Home / Self Care | Payer: Medicaid Other | Source: Ambulatory Visit | Attending: Internal Medicine | Admitting: Internal Medicine

## 2020-09-04 ENCOUNTER — Other Ambulatory Visit: Payer: Self-pay | Admitting: Internal Medicine

## 2020-09-04 DIAGNOSIS — N63 Unspecified lump in unspecified breast: Secondary | ICD-10-CM

## 2020-09-08 NOTE — Progress Notes (Signed)
Repeat breast ultrasound in 6 months. This was discussed with the patient by Emmaline Kluver, MD.

## 2020-10-04 ENCOUNTER — Telehealth: Payer: Medicaid Other

## 2020-10-04 ENCOUNTER — Telehealth (INDEPENDENT_AMBULATORY_CARE_PROVIDER_SITE_OTHER): Payer: Medicaid Other | Admitting: Nurse Practitioner

## 2020-10-04 DIAGNOSIS — M542 Cervicalgia: Secondary | ICD-10-CM

## 2020-10-04 MED ORDER — TIZANIDINE HCL 4 MG PO TABS: 4.0000 mg | ORAL_TABLET | Freq: Four times a day (QID) | ORAL | 0 refills | Status: AC | PRN

## 2020-10-04 NOTE — Patient Instructions (Signed)
Neck Pain:  - most likely muscle strain, however with history of possible injury in December before pain started, will order imaging  Will order c-spine x ray:  [facility] [address] [place], [place] [address] [phone] MON - FRI 8:00 AM - 4:00 PM - WALK IN  Will order muscle relaxer     Follow up:  Follow up with Dr. Earlene Plater in 1 month if needed    Cervical Sprain A cervical sprain is also called a neck sprain. It is a stretch or tear in one or more ligaments in the neck. Ligaments are tissues that connect bones to each other. Neck sprains can be mild, bad, or very bad. A very bad sprain in the neck can cause the bones in the neck to be unstable. This can damage the spinal cord. It can also cause serious problems in the brain, spinal cord, and nerves (nervous system). Most neck sprains heal in 4-6 weeks. It can take more or less time depending on:  What caused the injury.  The amount of injury. What are the causes? Neck sprains may be caused by trauma, such as:  An injury from an accident in a vehicle such as a car or boat.  A fall.  The head and neck being moved front to back or side to side all of a sudden (whiplash injury). Mild neck sprains may be caused by wear and tear over time. What increases the risk? The following factors may make you more likely to develop this condition:  Taking part in activities that put you at high risk of hurting your neck. These include: ? Contact sports. ? Actor. ? Gymnastics. ? Diving.  Taking risks when driving or riding in a vehicle such as a car or boat.  Arthritis caused by wear and tear of the joints in the spine.  The neck not being very strong or flexible.  Having had a neck injury in the past.  Poor posture.  Spending a lot of time in certain positions that put stress on the neck. This may be from sitting at a computer for a long time. What are the signs or symptoms? Symptoms of this condition  include:  Your neck, shoulders, or upper back feeling: ? Painful or sore. ? Stiff. ? Tender. ? Swollen. ? Hot, or like it is burning.  Sudden tightening of neck muscles (spasms).  Not being able to move the neck very much.  Headache.  Feeling dizzy.  Feeling like you may vomit, or vomiting.  Having a hand or arm that: ? Feels weak. ? Loses feeling (feels numb). ? Tingles. You may get symptoms right away after injury, or you may get them over a few days. In some cases, symptoms may go away with treatment and come back over time. How is this treated? This condition is treated by:  Resting your neck.  Icing the part of your neck that is hurt.  Doing exercises to restore movement and strength to your neck (physical therapy). If there is no swelling, you may use heat therapy 2-3 days after the injury took place. If your injury is very bad, treatment may also include:  Keeping your neck in place for a length of time. This may be done using: ? A neck collar. This supports your chin and the back of your head. ? A cervical traction device. This is a sling that holds up your head. The sling removes weight and pressure from your neck. It may also help  to relieve pain.  Medicines that help with: ? Pain. ? Irritation and swelling (inflammation).  Medicines that help to relax your muscles (muscle relaxants).  Surgery. This is rare. Follow these instructions at home: Medicines  Take over-the-counter and prescription medicines only as told by your doctor.  Ask your doctor if the medicine prescribed to you: ? Requires you to avoid driving or using heavy machinery. ? Can cause trouble pooping (constipation). You may need to take these actions to prevent or treat trouble pooping:  Drink enough fluid to keep your pee (urine) pale yellow.  Take over-the-counter or prescription medicines.  Eat foods that are high in fiber. These include beans, whole grains, and fresh fruits and  vegetables.  Limit foods that are high in fat and sugar. These include fried or sweet foods.   If you have a neck collar:  Wear it as told by your doctor. Do not take it off unless told.  Ask your doctor before adjusting your collar.  If you have long hair, keep it outside of the collar.  Ask your doctor if you may take off the collar for cleaning and bathing. If you may take off the collar: ? Follow instructions about how to take it off safely. ? Clean it by hand with mild soap and water. Let it air-dry fully. ? If your collar has pads that you can take out:  Take the pads out every 1-2 days.  Wash them by hand with soap and water.  Let the pads air-dry fully before you put them back in the collar. ? Tell your doctor if your skin under the collar has irritation or sores. Managing pain, stiffness, and swelling  Use a cervical traction device, if told by your doctor.  If told, put ice on the affected area. To do this: ? Put ice in a plastic bag. ? Place a towel between your skin and the bag. ? Leave the ice on for 20 minutes, 2-3 times a day.  If told, put heat on the affected area. Do this before exercise or as often as told by your doctor. Use the heat source that your doctor recommends, such as a moist heat pack or a heating pad. ? Place a towel between your skin and the heat source. ? Leave the heat on for 20-30 minutes. ? Take the heat off if your skin turns bright red. This is very important if you cannot feel pain, heat, or cold. You may have a greater risk of getting burned.      Activity  Do not drive while wearing a neck collar. If you do not have a neck collar, ask if it is safe to drive while your neck heals.  Do not lift anything that is heavier than 10 lb (4.5 kg), or the limit that you are told, until your doctor tells you that it is safe.  Rest as told by your doctor.  Do exercises as told by your doctor or physical therapist.  Return to your normal  activities as told by your doctor. Avoid positions and activities that make you feel worse. Ask your doctor what activities are safe for you. General instructions  Do not use any products that contain nicotine or tobacco, such as cigarettes, e-cigarettes, and chewing tobacco. These can delay healing. If you need help quitting, ask your doctor.  Keep all follow-up visits as told by your doctor or physical therapist. This is important. How is this prevented? To prevent a neck sprain from  happening again:  Practice good posture. Adjust your workstation to help you do this.  Exercise regularly as told by your doctor or physical therapist.  Avoid activities that are risky or may cause a neck sprain. Contact a doctor if:  Your symptoms get worse.  Your symptoms do not get better after 2 weeks of treatment.  Your pain gets worse.  Medicine does not help your pain.  You have new symptoms that you cannot explain.  Your neck collar gives you sores on your skin or bothers your skin. Get help right away if:  You have very bad pain.  You get any of the following in any part of your body: ? Loss of feeling. ? Tingling. ? Weakness.  You cannot move a part of your body.  You have neck pain and either of these: ? Very bad dizziness. ? A very bad headache. Summary  A cervical sprain is also called a neck sprain. It is a stretch or tear in one or more ligaments in the neck. Ligaments are tissues that connect bones.  Neck sprains may be caused by trauma, such as an injury or a fall.  You may get symptoms right away after injury, or you may get them over a few days.  Neck sprains may be treated with rest, heat, ice, medicines, exercise, and surgery. This information is not intended to replace advice given to you by your health care provider. Make sure you discuss any questions you have with your health care provider. Document Revised: 03/10/2019 Document Reviewed: 03/10/2019 Elsevier  Patient Education  2021 [employer].

## 2020-10-04 NOTE — Progress Notes (Signed)
Virtual Visit via Telephone Note  I connected with Vanessa Gallagher on 10/04/20 at  2:30 PM EDT by telephone and verified that I am speaking with the correct person using two identifiers.  Location: Patient: home Provider: Office   I discussed the limitations, risks, security and privacy concerns of performing an evaluation and management service by telephone and the availability of in person appointments. I also discussed with the patient that there may be a patient responsible charge related to this service. The patient expressed understanding and agreed to proceed.  Chief Complaint  Patient presents with  . Neck Pain    Started having neck pain in December - has not gotten better      History of Present Illness:  Patient presents today for acute visit for neck pain.  Patient was last seen by Dr. Earlene Plater on 07/14/2020.  She states that her neck was hurting at that time.  Did get Dr. Earlene Plater to examine her, but no prescriptions were sent at that time.  She has not had any imaging.  She admits that she did possibly have an injury before her neck started hurting.  She states that she went to a party and was drinking heavily and cannot remember what happened and her neck has been hurting since that time.  She describes the pain as hurting bilaterally on both sides of her neck and into her upper back region.  We discussed that this could possibly be muscle tension in the region.  We will send her to get an x-ray. Denies f/c/s, n/v/d, hemoptysis, PND, chest pain or edema.    Observations/Objective:  Vitals with BMI 07/14/2020 04/07/2020 08/24/2019  Height 5' 4.449" - -  Weight 114 lbs - -  BMI 19.3 - -  Systolic 109 104 951  Diastolic 77 72 74  Pulse 83 83 83      Assessment and Plan:  Neck Pain:  - most likely muscle strain, however with history of possible injury in December before pain started, will order imaging  Will order c-spine x ray:  [facility] [address] [place], [place] [address] [phone] MON - FRI 8:00 AM - 4:00 PM - WALK IN  Will order muscle relaxer     Follow up:  Follow up with Dr. Earlene Plater in 1 month if needed      I discussed the assessment and treatment plan with the patient. The patient was provided an opportunity to ask questions and all were answered. The patient agreed with the plan and demonstrated an understanding of the instructions.   The patient was advised to call back or seek an in-person evaluation if the symptoms worsen or if the condition fails to improve as anticipated.  I provided 23 minutes of non-face-to-face time during this encounter.   Ivonne Andrew, NP

## 2020-10-05 ENCOUNTER — Ambulatory Visit
Admission: RE | Admit: 2020-10-05 | Discharge: 2020-10-05 | Disposition: A | Discharge status: Home / Self Care | Payer: Medicaid Other | Source: Ambulatory Visit | Attending: Nurse Practitioner | Admitting: Nurse Practitioner

## 2020-10-05 ENCOUNTER — Other Ambulatory Visit: Payer: Self-pay

## 2020-10-18 ENCOUNTER — Other Ambulatory Visit: Payer: Self-pay

## 2020-10-18 ENCOUNTER — Encounter: Payer: Self-pay | Admitting: Internal Medicine

## 2020-10-18 ENCOUNTER — Ambulatory Visit (INDEPENDENT_AMBULATORY_CARE_PROVIDER_SITE_OTHER): Payer: Medicaid Other | Admitting: Internal Medicine

## 2020-10-18 VITALS — BP 126/78 | HR 71 | Temp 97.3°F | Resp 16 | Wt 115.0 lb

## 2020-10-18 DIAGNOSIS — Z3009 Encounter for other general counseling and advice on contraception: Secondary | ICD-10-CM

## 2020-10-18 DIAGNOSIS — R21 Rash and other nonspecific skin eruption: Secondary | ICD-10-CM

## 2020-10-18 DIAGNOSIS — M542 Cervicalgia: Secondary | ICD-10-CM

## 2020-10-18 MED ORDER — HYDROCORTISONE 2.5 % EX CREA
TOPICAL_CREAM | Freq: Every day | CUTANEOUS | 0 refills | Status: DC
Start: 2020-10-18 — End: 2024-05-17

## 2020-10-18 NOTE — Progress Notes (Signed)
Has concerns with rash on face  Discuss copper IUD lymh nodes and neck discomfort

## 2020-10-18 NOTE — Progress Notes (Signed)
  Subjective:    Vanessa Gallagher - 30 y.o. female MRN [id number]  Date of birth: [birth year]  HPI  Vanessa Gallagher is here for multiple concerns. Follow up for neck pain. Seen 3/23. Pain located at base of her occipital skull, bilaterally but not always occurring at same time. Radiates down the side of cervical spine and into upper back/shoulders. Prescribed Flexeril and reports causes too much sedation during day. Takes 1/2 tablet at night which seems to help. Has been taking Ibuprofen intermittently. Sometimes using heat and muscle rubs.   Also has rash present on her face. Was previously on cheeks but now more in corners of nose, alongside mouth, and on chin. Seems to be worsened by her mask. Father has psoriasis.      Health Maintenance:  Health Maintenance Due  Topic Date Due  . COVID-19 Vaccine (2 - Moderna 3-dose series) 04/10/2020    -  reports that she has been smoking e-cigarettes. She has a 2.50 pack-year smoking history. She has never used smokeless tobacco. - Review of Systems: Per HPI. - Past Medical History: Patient Active Problem List   Diagnosis Date Noted  . Bilateral neck pain 10/04/2020   - Medications: reviewed and updated   Objective:   Physical Exam BP 126/78   Pulse 71   Temp (!) 97.3 F (36.3 C)   Resp 16   Wt 115 lb (52.2 kg)   LMP 10/13/2020 (Approximate)   SpO2 99%   BMI 19.47 kg/m  Physical Exam Constitutional:      General: She is not in acute distress.    Appearance: She is not diaphoretic.  Cardiovascular:     Rate and Rhythm: Normal rate.  Pulmonary:     Effort: Pulmonary effort is normal. No respiratory distress.  Musculoskeletal:        General: Normal range of motion.     Comments: TTP over cervical spinal muscles. Full ROM present.   Skin:    General: Skin is warm and dry.     Comments: Erythematous mildly flaking rash present on corner of left nose extending down past mouth and onto chin.   Neurological:     Mental Status: She is  alert and oriented to person, place, and time.  Psychiatric:        Mood and Affect: Affect normal.        Judgment: Judgment normal.            Assessment & Plan:   1. Neck pain Suspect muscle strain. Neg cervical spine x-ray. Discussed supportive care measures at home. Will place referral for PT.  - Ambulatory referral to Physical Therapy  2. Rash and nonspecific skin eruption Likely eczema exacerbated by irritation from mask use. Will prescribe low potency topical steroid. Discussed appropriate use and other skin care measures.  - hydrocortisone 2.5 % cream; Apply topically daily.  Dispense: 30 g; Refill: 0  3. Encounter for other general counseling or advice on contraception Patient interested in Copper IUD. Do not stock at this office so will refer to GYN for placement.  - Ambulatory referral to Obstetrics / Gynecology   Marcy Siren, D.O. 10/18/2020, 10:25 AM Primary Care at [facility]

## 2020-10-23 ENCOUNTER — Ambulatory Visit
Admission: RE | Admit: 2020-10-23 | Discharge: 2020-10-23 | Disposition: A | Discharge status: Home / Self Care | Payer: Medicaid Other | Source: Ambulatory Visit | Attending: Emergency Medicine | Admitting: Emergency Medicine

## 2020-10-23 ENCOUNTER — Other Ambulatory Visit: Payer: Self-pay

## 2020-10-23 VITALS — BP 109/65 | HR 87 | Temp 98.5°F | Resp 16

## 2020-10-23 DIAGNOSIS — N390 Urinary tract infection, site not specified: Secondary | ICD-10-CM | POA: Insufficient documentation

## 2020-10-23 LAB — POCT URINALYSIS DIP (MANUAL ENTRY)
Glucose, UA: 100 mg/dL — AB
Nitrite, UA: POSITIVE — AB
Protein Ur, POC: >=300 mg/dL — AB
Spec Grav, UA: 1.01 (ref 1.010–1.025)
Urobilinogen, UA: 2 E.U./dL — AB
pH, UA: 5.5 (ref 5.0–8.0)

## 2020-10-23 LAB — POCT URINE PREGNANCY: Preg Test, Ur: NEGATIVE

## 2020-10-23 MED ORDER — CEPHALEXIN 500 MG PO CAPS: 500.0000 mg | ORAL_CAPSULE | Freq: Two times a day (BID) | ORAL | 0 refills | Status: AC

## 2020-10-23 NOTE — ED Provider Notes (Signed)
[name]    CSN: [id number] Arrival date & time: 10/23/20  1049      History   Chief Complaint Chief Complaint  Patient presents with  . Urinary Frequency  . Appointment    11:00 AM    HPI Vanessa Gallagher is a 30 y.o. female presenting today for evaluation of possible UTI.  Patient reports that she has had pelvic pressure x3 days since menstrual cycle.  Has also had associated frequency dysuria and flank pain.  Used Azo over the weekend.  History of prior UTIs, feels similar but slightly worse, reports abdominal discomfort and bloating in lower abdomen.  Reports some back pain which has since eased off.  Denies any known measured fevers, but has had subjective fevers.  HPI  History reviewed. No pertinent past medical history.  Patient Active Problem List   Diagnosis Date Noted  . Bilateral neck pain 10/04/2020    Past Surgical History:  Procedure Laterality Date  . INDUCED ABORTION      OB History    Gravida  2   Para  1   Term  1   Preterm      AB      Living  1     SAB      IAB      Ectopic      Multiple  0   Live Births  1            Home Medications    Prior to Admission medications   Medication Sig Start Date End Date Taking? Authorizing Provider  cephALEXin (KEFLEX) 500 MG capsule Take 1 capsule (500 mg total) by mouth 2 (two) times daily for 7 days. 10/23/20 10/30/20 Yes Kaliyan Osbourn C, PA-C  hydrocortisone 2.5 % cream Apply topically daily. 10/18/20   Arvilla Market, DO  tiZANidine (ZANAFLEX) 4 MG tablet Take 4 mg by mouth every 6 (six) hours as needed for muscle spasms.    [provider]  ferrous sulfate 325 (65 FE) MG tablet Take 1 tablet (325 mg total) by mouth 3 (three) times daily with meals. 02/07/18 08/24/19  Gunnar Bulla, CNM  loratadine (CLARITIN) 10 MG tablet Take 1 tablet (10 mg total) by mouth daily. 03/05/18 08/24/19  Shambley, Melody N, CNM  ranitidine (ZANTAC) 150 MG tablet Take 1  tablet (150 mg total) by mouth 2 (two) times daily. Patient not taking: Reported on 02/04/2018 11/25/17 08/24/19  Purcell Nails, CNM    Family History Family History  Problem Relation Age of Onset  . COPD Father     Social History Social History   Tobacco Use  . Smoking status: Current Every Day Smoker    Packs/day: 0.25    Years: 10.00    Pack years: 2.50    Types: E-cigarettes  . Smokeless tobacco: Never Used  Vaping Use  . Vaping Use: Some days  Substance Use Topics  . Alcohol use: Yes    Alcohol/week: 6.0 standard drinks    Types: 6 Cans of beer per week  . Drug use: No     Allergies   Banana   Review of Systems Review of Systems  Constitutional: Negative for fever.  Respiratory: Negative for shortness of breath.   Cardiovascular: Negative for chest pain.  Gastrointestinal: Negative for abdominal pain, diarrhea, nausea and vomiting.  Genitourinary: Positive for dysuria and frequency. Negative for flank pain, genital sores, hematuria, menstrual problem, vaginal bleeding, vaginal discharge and vaginal pain.  Musculoskeletal: Negative  for back pain.  Skin: Negative for rash.  Neurological: Negative for dizziness, light-headedness and headaches.     Physical Exam Triage Vital Signs ED Triage Vitals  Enc Vitals Group     BP 10/23/20 1107 109/65     Pulse Rate 10/23/20 1107 87     Resp 10/23/20 1107 16     Temp 10/23/20 1107 98.5 F (36.9 C)     Temp Source 10/23/20 1107 Oral     SpO2 --      Weight --      Height --      Head Circumference --      Peak Flow --      Pain Score 10/23/20 1105 6     Pain Loc --      Pain Edu? --      Excl. in GC? --    No data found.  Updated Vital Signs BP 109/65 (BP Location: Left Arm)   Pulse 87   Temp 98.5 F (36.9 C) (Oral)   Resp 16   LMP 10/13/2020   Breastfeeding No   Visual Acuity Right Eye Distance:   Left Eye Distance:   Bilateral Distance:    Right Eye Near:   Left Eye Near:    Bilateral  Near:     Physical Exam Vitals and nursing note reviewed.  Constitutional:      Appearance: She is well-developed.     Comments: No acute distress  HENT:     Head: Normocephalic and atraumatic.     Nose: Nose normal.  Eyes:     Conjunctiva/sclera: Conjunctivae normal.  Cardiovascular:     Rate and Rhythm: Normal rate.  Pulmonary:     Effort: Pulmonary effort is normal. No respiratory distress.  Abdominal:     General: There is no distension.  Musculoskeletal:        General: Normal range of motion.     Cervical back: Neck supple.  Skin:    General: Skin is warm and dry.  Neurological:     Mental Status: She is alert and oriented to person, place, and time.      UC Treatments / Results  Labs (all labs ordered are listed, but only abnormal results are displayed) Labs Reviewed  POCT URINALYSIS DIP (MANUAL ENTRY) - Abnormal; Notable for the following components:      Result Value   Color, UA orange (*)    Clarity, UA cloudy (*)    Glucose, UA =100 (*)    Bilirubin, UA small (*)    Ketones, POC UA trace (5) (*)    Blood, UA large (*)    Protein Ur, POC >=300 (*)    Urobilinogen, UA 2.0 (*)    Nitrite, UA Positive (*)    Leukocytes, UA Large (3+) (*)    All other components within normal limits  URINE CULTURE  POCT URINE PREGNANCY  CERVICOVAGINAL ANCILLARY ONLY    EKG   Radiology No results found.  Procedures Procedures (including critical care time)  Medications Ordered in UC Medications - No data to display  Initial Impression / Assessment and Plan / UC Course  I have reviewed the triage vital signs and the nursing notes.  Pertinent labs & imaging results that were available during my care of the patient were reviewed by me and considered in my medical decision making (see chart for details).     UA pan positive from recently taking Azo, treating for UTI with Keflex, urine culture pending along  with vaginal swab.  Push fluids.  Discussed strict  return precautions. Patient verbalized understanding and is agreeable with plan.  Final Clinical Impressions(s) / UC Diagnoses   Final diagnoses:  Lower urinary tract infection, acute     Discharge Instructions     Begin Keflex twice daily for 1 week to treat UTI Urine culture and vaginal swab pending to confirm Drink plenty of fluids Follow-up if not improving or worsening    ED Prescriptions    Medication Sig Dispense Auth. Provider   cephALEXin (KEFLEX) 500 MG capsule Take 1 capsule (500 mg total) by mouth 2 (two) times daily for 7 days. 14 capsule Demitrius Crass, [place] C, PA-C     PDMP not reviewed this encounter.   Lew Dawes, PA-C 10/23/20 1152

## 2020-10-23 NOTE — Discharge Instructions (Addendum)
Begin Keflex twice daily for 1 week to treat UTI Urine culture and vaginal swab pending to confirm Drink plenty of fluids Follow-up if not improving or worsening

## 2020-10-23 NOTE — ED Triage Notes (Signed)
Patient presents to Urgent Care with complaints of pelvic pressure since 3 days ago after finishing up menstrual cycle . Patient reports urine frequency, dysuria, flank pain. Took Azo on Sat morning.

## 2020-10-24 ENCOUNTER — Telehealth (HOSPITAL_COMMUNITY): Payer: Self-pay | Admitting: Emergency Medicine

## 2020-10-24 LAB — CERVICOVAGINAL ANCILLARY ONLY
Bacterial Vaginitis (gardnerella): NEGATIVE
Candida Glabrata: NEGATIVE
Candida Vaginitis: POSITIVE — AB
Chlamydia: NEGATIVE
Comment: NEGATIVE
Comment: NEGATIVE
Comment: NEGATIVE
Comment: NEGATIVE
Comment: NEGATIVE
Comment: NORMAL
Neisseria Gonorrhea: NEGATIVE
Trichomonas: NEGATIVE

## 2020-10-24 MED ORDER — FLUCONAZOLE 150 MG PO TABS: 150.0000 mg | ORAL_TABLET | Freq: Once | ORAL | 0 refills | Status: AC

## 2020-10-25 LAB — URINE CULTURE

## 2020-12-18 ENCOUNTER — Ambulatory Visit: Payer: Self-pay

## 2021-03-06 ENCOUNTER — Inpatient Hospital Stay: Admission: RE | Admit: 2021-03-06 | Payer: Medicaid Other | Source: Ambulatory Visit

## 2021-05-16 ENCOUNTER — Other Ambulatory Visit: Payer: Medicaid Other

## 2021-05-28 ENCOUNTER — Other Ambulatory Visit: Payer: Self-pay

## 2021-05-28 ENCOUNTER — Ambulatory Visit
Admission: RE | Admit: 2021-05-28 | Discharge: 2021-05-28 | Disposition: A | Discharge status: Home / Self Care | Payer: Medicaid Other | Source: Ambulatory Visit | Attending: Student | Admitting: Student

## 2021-05-28 VITALS — BP 147/84 | HR 90 | Temp 98.1°F | Resp 18 | Ht 64.0 in | Wt 118.0 lb

## 2021-05-28 DIAGNOSIS — J09X2 Influenza due to identified novel influenza A virus with other respiratory manifestations: Secondary | ICD-10-CM

## 2021-05-28 DIAGNOSIS — Z3202 Encounter for pregnancy test, result negative: Secondary | ICD-10-CM

## 2021-05-28 LAB — POC INFLUENZA A AND B ANTIGEN (URGENT CARE ONLY)
Influenza A Ag: POSITIVE — AB
Influenza B Ag: NEGATIVE

## 2021-05-28 LAB — POCT URINE PREGNANCY: Preg Test, Ur: NEGATIVE

## 2021-05-28 MED ORDER — ALBUTEROL SULFATE HFA 108 (90 BASE) MCG/ACT IN AERS
1.0000 | INHALATION_SPRAY | Freq: Four times a day (QID) | RESPIRATORY_TRACT | 0 refills | Status: DC | PRN
Start: 2021-05-28 — End: 2021-09-03

## 2021-05-28 MED ORDER — ONDANSETRON 8 MG PO TBDP: 8.0000 mg | ORAL_TABLET | Freq: Three times a day (TID) | ORAL | 0 refills | Status: DC | PRN

## 2021-05-28 MED ORDER — OSELTAMIVIR PHOSPHATE 75 MG PO CAPS: 75.0000 mg | ORAL_CAPSULE | Freq: Two times a day (BID) | ORAL | 0 refills | Status: DC

## 2021-05-28 MED ORDER — PROMETHAZINE-DM 6.25-15 MG/5ML PO SYRP: 5.0000 mL | ORAL_SOLUTION | Freq: Four times a day (QID) | ORAL | 0 refills | Status: DC | PRN

## 2021-05-28 NOTE — Discharge Instructions (Addendum)
-  Tamiflu twice daily x5 days. This medication can cause nausea, so I also sent nausea medication. You can stop the Tamiflu if you don't like it or if it causes side effects.  -Take the Zofran (ondansetron) up to 3 times daily for nausea and vomiting. -Promethazine DM cough syrup for congestion/cough. This could make you drowsy, so take at night before bed. -Albuterol inhaler as needed for cough, wheezing, shortness of breath, 1 to 2 puffs every 6 hours as needed. -For fevers/chills, bodyaches, headaches- You can take Tylenol up to 1000 mg 3 times daily, and ibuprofen up to 600 mg 3 times daily with food.  You can take these together, or alternate every 3-4 hours. -Drink plenty of water/gatorade and get plenty of rest -With a virus, you're typically contagious for 5-7 days, or as long as you're having fevers.  -Come back and see us if things are getting worse instead of better, like shortness of breath, chest pain, fevers and chills that are getting higher instead of lower and do not come down with Tylenol or ibuprofen, etc.  

## 2021-05-28 NOTE — ED Triage Notes (Signed)
Patient c/o productive cough, chest congestion, body aches since yesterday.  Patient has taken OTC cold meds.  Patient is vaccinated for COVID.

## 2021-05-28 NOTE — ED Provider Notes (Signed)
[name]    CSN: [id number] Arrival date & time: 05/28/21  1306      History   Chief Complaint Chief Complaint  Patient presents with   Appointment   Cough    HPI Vanessa Gallagher is a 30 y.o. female presenting with cough, congestion, generalized bodyaches. Medical history noncontributory.  Describes 2 days of symptoms following exposure to the flu.  Fevers ranging 99-101 at home, reduced by Tylenol.  Last use 3 hours ago.  States that her cough is productive of yellow sputum and she does have some shortness of breath during coughing and with ambulation.  Denies history of pulmonary disease though she did require an albuterol inhaler when she had COVID 1 year ago.  Decreased appetite but tolerating fluids and food.  She is not vaccinated for influenza. Unsure if she is pregnant, she took a plan B one day ago.  HPI  History reviewed. No pertinent past medical history.  Patient Active Problem List   Diagnosis Date Noted   Bilateral neck pain 10/04/2020    Past Surgical History:  Procedure Laterality Date   INDUCED ABORTION      OB History     Gravida  2   Para  1   Term  1   Preterm      AB      Living  1      SAB      IAB      Ectopic      Multiple  0   Live Births  1            Home Medications    Prior to Admission medications   Medication Sig Start Date End Date Taking? Authorizing Provider  albuterol (VENTOLIN HFA) 108 (90 Base) MCG/ACT inhaler Inhale 1-2 puffs into the lungs every 6 (six) hours as needed for wheezing or shortness of breath. 05/28/21  Yes Rhys Martini, PA-C  hydrocortisone 2.5 % cream Apply topically daily. 10/18/20  Yes Arvilla Market, MD  ondansetron (ZOFRAN ODT) 8 MG disintegrating tablet Take 1 tablet (8 mg total) by mouth every 8 (eight) hours as needed for nausea or vomiting. 05/28/21  Yes Rhys Martini, PA-C  oseltamivir (TAMIFLU) 75 MG capsule Take 1 capsule (75 mg total) by mouth every 12  (twelve) hours. 05/28/21  Yes Rhys Martini, PA-C  promethazine-dextromethorphan (PROMETHAZINE-DM) 6.25-15 MG/5ML syrup Take 5 mLs by mouth 4 (four) times daily as needed for cough. 05/28/21  Yes Rhys Martini, PA-C  tiZANidine (ZANAFLEX) 4 MG tablet Take 4 mg by mouth every 6 (six) hours as needed for muscle spasms.   Yes [provider]  ferrous sulfate 325 (65 FE) MG tablet Take 1 tablet (325 mg total) by mouth 3 (three) times daily with meals. 02/07/18 08/24/19  Gunnar Bulla, CNM  loratadine (CLARITIN) 10 MG tablet Take 1 tablet (10 mg total) by mouth daily. 03/05/18 08/24/19  Shambley, Melody N, CNM  ranitidine (ZANTAC) 150 MG tablet Take 1 tablet (150 mg total) by mouth 2 (two) times daily. Patient not taking: Reported on 02/04/2018 11/25/17 08/24/19  Purcell Nails, CNM    Family History Family History  Problem Relation Age of Onset   COPD Father     Social History Social History   Tobacco Use   Smoking status: Every Day    Packs/day: 0.25    Years: 10.00    Pack years: 2.50    Types: E-cigarettes, Cigarettes  Smokeless tobacco: Never  Vaping Use   Vaping Use: Some days  Substance Use Topics   Alcohol use: Yes    Alcohol/week: 6.0 standard drinks    Types: 6 Cans of beer per week   Drug use: No     Allergies   Banana   Review of Systems Review of Systems  Constitutional:  Positive for chills and fever. Negative for appetite change.  HENT:  Positive for congestion. Negative for ear pain, rhinorrhea, sinus pressure, sinus pain and sore throat.   Eyes:  Negative for redness and visual disturbance.  Respiratory:  Positive for cough. Negative for chest tightness, shortness of breath and wheezing.   Cardiovascular:  Negative for chest pain and palpitations.  Gastrointestinal:  Negative for abdominal pain, constipation, diarrhea, nausea and vomiting.  Genitourinary:  Negative for dysuria, frequency and urgency.  Musculoskeletal:  Positive for  myalgias.  Neurological:  Negative for dizziness, weakness and headaches.  Psychiatric/Behavioral:  Negative for confusion.   All other systems reviewed and are negative.   Physical Exam Triage Vital Signs ED Triage Vitals  Enc Vitals Group     BP 05/28/21 1329 (!) 147/84     Pulse Rate 05/28/21 1329 90     Resp 05/28/21 1329 18     Temp 05/28/21 1329 98.1 F (36.7 C)     Temp Source 05/28/21 1329 Oral     SpO2 05/28/21 1329 98 %     Weight 05/28/21 1331 118 lb (53.5 kg)     Height 05/28/21 1331 5\' 4"  (1.626 m)     Head Circumference --      Peak Flow --      Pain Score 05/28/21 1330 4     Pain Loc --      Pain Edu? --      Excl. in GC? --    No data found.  Updated Vital Signs BP (!) 147/84 (BP Location: Left Arm)   Pulse 90   Temp 98.1 F (36.7 C) (Oral)   Resp 18   Ht 5\' 4"  (1.626 m)   Wt 118 lb (53.5 kg)   LMP 05/08/2021   SpO2 98%   BMI 20.25 kg/m   Visual Acuity Right Eye Distance:   Left Eye Distance:   Bilateral Distance:    Right Eye Near:   Left Eye Near:    Bilateral Near:     Physical Exam Vitals reviewed.  Constitutional:      General: She is not in acute distress.    Appearance: Normal appearance. She is not ill-appearing.  HENT:     Head: Normocephalic and atraumatic.     Right Ear: Tympanic membrane, ear canal and external ear normal. No tenderness. No middle ear effusion. There is no impacted cerumen. Tympanic membrane is not perforated, erythematous, retracted or bulging.     Left Ear: Tympanic membrane, ear canal and external ear normal. No tenderness.  No middle ear effusion. There is no impacted cerumen. Tympanic membrane is not perforated, erythematous, retracted or bulging.     Nose: Nose normal. No congestion.     Mouth/Throat:     Mouth: Mucous membranes are moist.     Pharynx: Uvula midline. No oropharyngeal exudate or posterior oropharyngeal erythema.  Eyes:     Extraocular Movements: Extraocular movements intact.     Pupils:  Pupils are equal, round, and reactive to light.  Cardiovascular:     Rate and Rhythm: Normal rate and regular rhythm.     Heart  sounds: Normal heart sounds.  Pulmonary:     Effort: Pulmonary effort is normal.     Breath sounds: Normal breath sounds. No decreased breath sounds, wheezing, rhonchi or rales.     Comments: Frequent cough Abdominal:     Palpations: Abdomen is soft.     Tenderness: There is no abdominal tenderness. There is no guarding or rebound.  Lymphadenopathy:     Cervical: No cervical adenopathy.     Right cervical: No superficial cervical adenopathy.    Left cervical: No superficial cervical adenopathy.  Neurological:     General: No focal deficit present.     Mental Status: She is alert and oriented to person, place, and time.  Psychiatric:        Mood and Affect: Mood normal.        Behavior: Behavior normal.        Thought Content: Thought content normal.        Judgment: Judgment normal.     UC Treatments / Results  Labs (all labs ordered are listed, but only abnormal results are displayed) Labs Reviewed  POC INFLUENZA A AND B ANTIGEN (URGENT CARE ONLY) - Abnormal; Notable for the following components:      Result Value   Influenza A Ag Positive (*)    All other components within normal limits  POCT URINE PREGNANCY    EKG   Radiology No results found.  Procedures Procedures (including critical care time)  Medications Ordered in UC Medications - No data to display  Initial Impression / Assessment and Plan / UC Course  I have reviewed the triage vital signs and the nursing notes.  Pertinent labs & imaging results that were available during my care of the patient were reviewed by me and considered in my medical decision making (see chart for details).     This patient is a very pleasant 30 y.o. year old female presenting with influenza A. Today this pt is afebrile nontachycardic nontachypneic, oxygenating well on room air, no wheezes rhonchi  or rales. Last antipyretic 3 hours ago  Rapid influenza positive Negative u-preg. Tamiflu, zofran ODT, albuterol, promethazine DM.  ED return precautions discussed. Patient verbalizes understanding and agreement.      Final Clinical Impressions(s) / UC Diagnoses   Final diagnoses:  Influenza due to identified novel influenza A virus with other respiratory manifestations  Negative pregnancy test     Discharge Instructions      -Tamiflu twice daily x5 days. This medication can cause nausea, so I also sent nausea medication. You can stop the Tamiflu if you don't like it or if it causes side effects.  -Take the Zofran (ondansetron) up to 3 times daily for nausea and vomiting. -Promethazine DM cough syrup for congestion/cough. This could make you drowsy, so take at night before bed. -Albuterol inhaler as needed for cough, wheezing, shortness of breath, 1 to 2 puffs every 6 hours as needed. -For fevers/chills, bodyaches, headaches- You can take Tylenol up to 1000 mg 3 times daily, and ibuprofen up to 600 mg 3 times daily with food.  You can take these together, or alternate every 3-4 hours. -Drink plenty of water/gatorade and get plenty of rest -With a virus, you're typically contagious for 5-7 days, or as long as you're having fevers.  -Come back and see [place] if things are getting worse instead of better, like shortness of breath, chest pain, fevers and chills that are getting higher instead of lower and do not come down with  Tylenol or ibuprofen, etc.      ED Prescriptions     Medication Sig Dispense Auth. Provider   oseltamivir (TAMIFLU) 75 MG capsule Take 1 capsule (75 mg total) by mouth every 12 (twelve) hours. 10 capsule Marin Roberts E, PA-C   ondansetron (ZOFRAN ODT) 8 MG disintegrating tablet Take 1 tablet (8 mg total) by mouth every 8 (eight) hours as needed for nausea or vomiting. 20 tablet Hazel Sams, PA-C   promethazine-dextromethorphan (PROMETHAZINE-DM) 6.25-15  MG/5ML syrup Take 5 mLs by mouth 4 (four) times daily as needed for cough. 118 mL Hazel Sams, PA-C   albuterol (VENTOLIN HFA) 108 (90 Base) MCG/ACT inhaler Inhale 1-2 puffs into the lungs every 6 (six) hours as needed for wheezing or shortness of breath. 1 each Hazel Sams, PA-C      PDMP not reviewed this encounter.   Hazel Sams, PA-C 05/28/21 1440

## 2021-06-05 ENCOUNTER — Other Ambulatory Visit: Payer: Self-pay | Admitting: Internal Medicine

## 2021-06-05 ENCOUNTER — Other Ambulatory Visit: Payer: Self-pay

## 2021-06-05 ENCOUNTER — Ambulatory Visit
Admission: RE | Admit: 2021-06-05 | Discharge: 2021-06-05 | Disposition: A | Discharge status: Home / Self Care | Payer: Medicaid Other | Source: Ambulatory Visit | Attending: Internal Medicine | Admitting: Internal Medicine

## 2021-06-05 DIAGNOSIS — N63 Unspecified lump in unspecified breast: Secondary | ICD-10-CM

## 2021-07-30 ENCOUNTER — Other Ambulatory Visit (HOSPITAL_COMMUNITY)
Admission: RE | Admit: 2021-07-30 | Discharge: 2021-07-30 | Disposition: A | Discharge status: Home / Self Care | Payer: Medicaid Other | Source: Ambulatory Visit | Attending: Nurse Practitioner | Admitting: Nurse Practitioner

## 2021-07-30 ENCOUNTER — Telehealth: Payer: Medicaid Other | Admitting: Nurse Practitioner

## 2021-07-30 ENCOUNTER — Encounter: Payer: Self-pay | Admitting: Family

## 2021-07-30 ENCOUNTER — Ambulatory Visit (INDEPENDENT_AMBULATORY_CARE_PROVIDER_SITE_OTHER): Payer: Medicaid Other | Admitting: Family

## 2021-07-30 VITALS — BP 121/84 | HR 85 | Temp 98.3°F | Resp 18 | Ht 64.45 in | Wt 114.0 lb

## 2021-07-30 DIAGNOSIS — Z113 Encounter for screening for infections with a predominantly sexual mode of transmission: Secondary | ICD-10-CM

## 2021-07-30 DIAGNOSIS — Z3202 Encounter for pregnancy test, result negative: Secondary | ICD-10-CM | POA: Diagnosis not present

## 2021-07-30 LAB — POCT URINE PREGNANCY: Preg Test, Ur: NEGATIVE

## 2021-07-30 LAB — POCT URINALYSIS DIP (CLINITEK)
Glucose, UA: NEGATIVE mg/dL
Ketones, POC UA: NEGATIVE mg/dL
Leukocytes, UA: NEGATIVE
Nitrite, UA: NEGATIVE
POC PROTEIN,UA: 30 — AB
Spec Grav, UA: >=1.03 — AB (ref 1.010–1.025)
Urobilinogen, UA: 0.2 E.U./dL
pH, UA: 6 (ref 5.0–8.0)

## 2021-07-30 NOTE — Progress Notes (Signed)
Pt presents for STD testing due to new partner

## 2021-07-30 NOTE — Progress Notes (Signed)
No urinary tract infection. Patient reports menses began today.

## 2021-07-30 NOTE — Progress Notes (Signed)
Patient ID: Vanessa Gallagher, female    DOB: [birth year]  MRN: [id number]  CC: STD Testing  Subjective: Vanessa Gallagher is a 31 y.o. female who presents for STD testing.   Her concerns today include:  Requesting STD testing reporting she recently has a new partner. Reports history of a few partners in the past prior to new partner. LMP 07/30/2021. Denies symptoms. Reports history of trichomonas during pregnancy. Using condoms as preventative measure. Plans to get copper IUD soon.   Patient Active Problem List   Diagnosis Date Noted   Bilateral neck pain 10/04/2020     Current Outpatient Medications on File Prior to Visit  Medication Sig Dispense Refill   albuterol (VENTOLIN HFA) 108 (90 Base) MCG/ACT inhaler Inhale 1-2 puffs into the lungs every 6 (six) hours as needed for wheezing or shortness of breath. 1 each 0   hydrocortisone 2.5 % cream Apply topically daily. 30 g 0   ondansetron (ZOFRAN ODT) 8 MG disintegrating tablet Take 1 tablet (8 mg total) by mouth every 8 (eight) hours as needed for nausea or vomiting. 20 tablet 0   oseltamivir (TAMIFLU) 75 MG capsule Take 1 capsule (75 mg total) by mouth every 12 (twelve) hours. 10 capsule 0   promethazine-dextromethorphan (PROMETHAZINE-DM) 6.25-15 MG/5ML syrup Take 5 mLs by mouth 4 (four) times daily as needed for cough. 118 mL 0   tiZANidine (ZANAFLEX) 4 MG tablet Take 4 mg by mouth every 6 (six) hours as needed for muscle spasms.     [DISCONTINUED] ferrous sulfate 325 (65 FE) MG tablet Take 1 tablet (325 mg total) by mouth 3 (three) times daily with meals. 90 tablet 3   [DISCONTINUED] loratadine (CLARITIN) 10 MG tablet Take 1 tablet (10 mg total) by mouth daily. 30 tablet 2   [DISCONTINUED] ranitidine (ZANTAC) 150 MG tablet Take 1 tablet (150 mg total) by mouth 2 (two) times daily. (Patient not taking: Reported on 02/04/2018) 60 tablet 4   No current facility-administered medications on file prior to visit.    Allergies  Allergen Reactions    Banana Itching    Social History   Socioeconomic History   Marital status: Single    Spouse name: Not on file   Number of children: Not on file   Years of education: Not on file   Highest education level: Not on file  Occupational History   Not on file  Tobacco Use   Smoking status: Every Day    Packs/day: 0.25    Years: 10.00    Pack years: 2.50    Types: E-cigarettes, Cigarettes   Smokeless tobacco: Never  Vaping Use   Vaping Use: Some days  Substance and Sexual Activity   Alcohol use: Yes    Alcohol/week: 6.0 standard drinks    Types: 6 Cans of beer per week   Drug use: No   Sexual activity: Yes    Birth control/protection: None  Other Topics Concern   Not on file  Social History Narrative   Not on file   Social Determinants of Health   Financial Resource Strain: Not on file  Food Insecurity: Not on file  Transportation Needs: Not on file  Physical Activity: Not on file  Stress: Not on file  Social Connections: Not on file  Intimate Partner Violence: Not on file    Family History  Problem Relation Age of Onset   COPD Father     Past Surgical History:  Procedure Laterality Date   INDUCED ABORTION  ROS: Review of Systems Negative except as stated above  PHYSICAL EXAM: BP 121/84 (BP Location: Left Arm, Patient Position: Sitting, Cuff Size: Normal)    Pulse 85    Temp 98.3 F (36.8 C)    Resp 18    Ht 5' 4.45" (1.637 m)    Wt 114 lb (51.7 kg)    SpO2 96%    BMI 19.30 kg/m   Physical Exam HENT:     Head: Normocephalic and atraumatic.  Eyes:     Extraocular Movements: Extraocular movements intact.     Conjunctiva/sclera: Conjunctivae normal.     Pupils: Pupils are equal, round, and reactive to light.  Cardiovascular:     Rate and Rhythm: Normal rate and regular rhythm.     Pulses: Normal pulses.     Heart sounds: Normal heart sounds.  Pulmonary:     Effort: Pulmonary effort is normal.     Breath sounds: Normal breath sounds.   Musculoskeletal:     Cervical back: Normal range of motion and neck supple.  Neurological:     General: No focal deficit present.     Mental Status: She is alert and oriented to person, place, and time.  Psychiatric:        Mood and Affect: Mood normal.        Behavior: Behavior normal.   Results for orders placed or performed in visit on 07/30/21  POCT urine pregnancy  Result Value Ref Range   Preg Test, Ur Negative Negative  POCT URINALYSIS DIP (CLINITEK)  Result Value Ref Range   Color, UA yellow yellow   Clarity, UA turbid (A) clear   Glucose, UA negative negative mg/dL   Bilirubin, UA small (A) negative   Ketones, POC UA negative negative mg/dL   Spec Grav, UA >=1.030 (A) 1.010 - 1.025   Blood, UA moderate (A) negative   pH, UA 6.0 5.0 - 8.0   POC PROTEIN,UA =30 (A) negative, trace   Urobilinogen, UA 0.2 0.2 or 1.0 E.U./dL   Nitrite, UA Negative Negative   Leukocytes, UA Negative Negative     ASSESSMENT AND PLAN: 1. Routine screening for STI (sexually transmitted infection): - Screening completed today in office.  - Cervicovaginal ancillary only - POCT urine pregnancy - RPR w/reflex to TrepSure - HIV antibody (with reflex) - POCT URINALYSIS DIP (CLINITEK)    Patient was given the opportunity to ask questions.  Patient verbalized understanding of the plan and was able to repeat key elements of the plan. Patient was given clear instructions to go to Emergency Department or return to medical center if symptoms don't improve, worsen, or new problems develop.The patient verbalized understanding.   Orders Placed This Encounter  Procedures   RPR w/reflex to TrepSure   HIV antibody (with reflex)   POCT glycosylated hemoglobin (Hb A1C)   POCT urine pregnancy    Follow-up with primary provider as scheduled.   Camillia Herter, NP

## 2021-07-30 NOTE — Patient Instructions (Signed)
Safe Sex Practicing safe sex means taking steps before and during sex to reduce your risk of: Getting an STI (sexually transmitted infection). Giving your partner an STI. Unwanted or unplanned pregnancy. How to practice safe sex Ways you can practice safe sex  Limit your sexual partners to only one partner who is having sex with only you. Avoid using alcohol and drugs before having sex. Alcohol and drugs can affect your judgment. Before having sex with a new partner: Talk to your partner about past partners, past STIs, and drug use. Get screened for STIs and discuss the results with your partner. Ask your partner to get screened too. Check your body regularly for sores, blisters, rashes, or unusual discharge. If you notice any of these problems, visit your health care provider. Avoid sexual contact if you have symptoms of an infection or you are being treated for an STI. While having sex, use a condom. Make sure to: Use a condom every time you have vaginal, oral, or anal sex. Both females and males should wear condoms during oral sex. Keep condoms in place from the beginning to the end of sexual activity. Use a latex condom, if possible. Latex condoms offer the best protection. Use only water-based lubricants with a condom. Using petroleum-based lubricants or oils will weaken the condom and increase the chance that it will break. Ways your health care provider can help you practice safe sex  See your health care provider for regular screenings, exams, and tests for STIs. Talk with your health care provider about what kind of birth control (contraception) is best for you. Get vaccinated against hepatitis B and human papillomavirus (HPV). If you are at risk of being infected with HIV (human immunodeficiency virus), talk with your health care provider about taking a prescription medicine to prevent HIV infection. You are at risk for HIV if you: Are a man who has sex with other men. Are  sexually active with more than one partner. Take drugs by injection. Have a sex partner who has HIV. Have unprotected sex. Have sex with someone who has sex with both men and women. Have had an STI. Follow these instructions at home: Take over-the-counter and prescription medicines only as told by your health care provider. Keep all follow-up visits. This is important. Where to find more information Centers for Disease Control and Prevention: www.cdc.gov Planned Parenthood: www.plannedparenthood.org Office on Women's Health: www.womenshealth.gov Summary Practicing safe sex means taking steps before and during sex to reduce your risk getting an STI, giving your partner an STI, and having an unwanted or unplanned pregnancy. Before having sex with a new partner, talk to your partner about past partners, past STIs, and drug use. Use a condom every time you have vaginal, oral, or anal sex. Both females and males should wear condoms during oral sex. Check your body regularly for sores, blisters, rashes, or unusual discharge. If you notice any of these problems, visit your health care provider. See your health care provider for regular screenings, exams, and tests for STIs. This information is not intended to replace advice given to you by your health care provider. Make sure you discuss any questions you have with your health care provider. Document Revised: 12/06/2019 Document Reviewed: 12/06/2019 Elsevier Patient Education  2022 [employer]vier Inc.  

## 2021-07-31 ENCOUNTER — Encounter: Payer: Self-pay | Admitting: Family

## 2021-07-31 LAB — CERVICOVAGINAL ANCILLARY ONLY
Bacterial Vaginitis (gardnerella): NEGATIVE
Candida Glabrata: NEGATIVE
Candida Vaginitis: NEGATIVE
Chlamydia: NEGATIVE
Comment: NEGATIVE
Comment: NEGATIVE
Comment: NEGATIVE
Comment: NEGATIVE
Comment: NEGATIVE
Comment: NORMAL
Neisseria Gonorrhea: NEGATIVE
Trichomonas: NEGATIVE

## 2021-07-31 LAB — HIV ANTIBODY (ROUTINE TESTING W REFLEX): HIV Screen 4th Generation wRfx: NONREACTIVE

## 2021-07-31 LAB — RPR: RPR Ser Ql: NONREACTIVE

## 2021-07-31 NOTE — Progress Notes (Signed)
HIV negative.   Syphilis negative.

## 2021-07-31 NOTE — Progress Notes (Signed)
Gonorrhea, Chlamydia, Trichomonas, Bacterial Vaginitis, and Candida Vaginitis (sometimes called a yeast infection) negative.

## 2021-08-02 ENCOUNTER — Other Ambulatory Visit: Payer: Self-pay

## 2021-08-02 ENCOUNTER — Encounter: Payer: Self-pay | Admitting: Obstetrics and Gynecology

## 2021-08-02 ENCOUNTER — Ambulatory Visit (INDEPENDENT_AMBULATORY_CARE_PROVIDER_SITE_OTHER): Payer: Medicaid Other | Admitting: Obstetrics and Gynecology

## 2021-08-02 VITALS — BP 124/87 | HR 75 | Temp 97.7°F | Ht 64.45 in | Wt 113.6 lb

## 2021-08-02 DIAGNOSIS — Z30430 Encounter for insertion of intrauterine contraceptive device: Secondary | ICD-10-CM

## 2021-08-02 DIAGNOSIS — Z01812 Encounter for preprocedural laboratory examination: Secondary | ICD-10-CM | POA: Diagnosis not present

## 2021-08-02 LAB — POCT URINE PREGNANCY: Preg Test, Ur: NEGATIVE

## 2021-08-02 MED ORDER — PARAGARD INTRAUTERINE COPPER IU IUD
1.0000 | INTRAUTERINE_SYSTEM | Freq: Once | INTRAUTERINE | Status: AC
  Administered 2021-08-02: 1 via INTRAUTERINE

## 2021-08-02 MED ORDER — IBUPROFEN 200 MG PO TABS
800.0000 mg | ORAL_TABLET | Freq: Once | ORAL | Status: AC
  Administered 2021-08-02: 800 mg via ORAL

## 2021-08-02 NOTE — Progress Notes (Signed)
° ° °  IUD INSERTION PROCEDURE NOTE  Vanessa Gallagher is a 31 y.o. [phone] here for Paragard insertion. No GYN concerns.   Vitals:   08/02/21 1544  BP: 124/87  Pulse: 75  Temp: 97.7 F (36.5 C)    She was counseled regarding the risks/benefits of IUD including insertion risk of infection, hemorrhage, damage to surrounding tissue and organs, uterine perforation. She was counseled regarding risks of IUD including implantation into uterine wall, migration outside of uterus, possible need for hysteroscopic or laparoscopic removal, ovarian cysts, expulsion. She was advised that risk of pregnancy is low with negative UPT but is not zero and IUD insertion may cause miscarriage. Reviewed that she is also at slightly higher risk for ectopic pregnancy and she should take a pregnancy test if she believes she may be pregnant. She was advised to use backup method of protection for one week. She verbalized understanding of all of the above and consent signed.   Last intercourse was 07/30/2021 and protected Last pap smear was on normal and was 07/14/2020 UPT today: Negative  IUD Insertion  Patient identified and an adequate time out was performed. Speculum placed in the vagina. The cervix was cleaned with Betadine x 2 and grasped anteriorly with a single tooth tenaculum.  A uterine sound was used to sound the uterus to 7 cm;  the IUD was then placed per manufacturer's recommendations. Strings trimmed to 3 cm. Tenaculum was removed, good hemostasis noted after mild pressure using Fox swabs. Patient tolerated procedure well. Ibuprofen 800 mg po given post-procedure.  Patient was given post-procedure instructions.  She was reminded to have backup contraception for one week during this transition period between IUDs.  Patient was also asked to check IUD strings periodically and follow up in 4 weeks for IUD check.  Paragard IUD Exp: 10/2025 Lot: [id number]  Total face-to-face time of 20 minutes was spent discussing IUD  risks, procedure and follow-up.  Raelyn Mora, CNM 08/02/2021 4:26 PM

## 2021-08-30 ENCOUNTER — Ambulatory Visit: Payer: Medicaid Other | Admitting: Obstetrics and Gynecology

## 2021-08-31 ENCOUNTER — Ambulatory Visit
Admission: RE | Admit: 2021-08-31 | Discharge: 2021-08-31 | Disposition: A | Discharge status: Home / Self Care | Payer: Medicaid Other | Source: Ambulatory Visit | Attending: Physician Assistant | Admitting: Physician Assistant

## 2021-08-31 ENCOUNTER — Other Ambulatory Visit: Payer: Self-pay

## 2021-08-31 VITALS — BP 113/76 | HR 85 | Temp 97.9°F | Resp 18

## 2021-08-31 DIAGNOSIS — R300 Dysuria: Secondary | ICD-10-CM | POA: Insufficient documentation

## 2021-08-31 DIAGNOSIS — N3000 Acute cystitis without hematuria: Secondary | ICD-10-CM | POA: Diagnosis present

## 2021-08-31 LAB — POCT URINALYSIS DIP (MANUAL ENTRY)
Bilirubin, UA: NEGATIVE
Glucose, UA: NEGATIVE mg/dL
Ketones, POC UA: NEGATIVE mg/dL
Nitrite, UA: NEGATIVE
Protein Ur, POC: NEGATIVE mg/dL
Spec Grav, UA: 1.01 (ref 1.010–1.025)
Urobilinogen, UA: 0.2 E.U./dL
pH, UA: 6.5 (ref 5.0–8.0)

## 2021-08-31 MED ORDER — NITROFURANTOIN MONOHYD MACRO 100 MG PO CAPS: 100.0000 mg | ORAL_CAPSULE | Freq: Two times a day (BID) | ORAL | 0 refills | Status: DC

## 2021-08-31 NOTE — ED Provider Notes (Signed)
[name]    CSN: [id number] Arrival date & time: 08/31/21  1105      History   Chief Complaint Chief Complaint  Patient presents with   uti sx    HPI Vanessa Gallagher is a 31 y.o. female.   Patient here today for evaluation of dysuria, urinary urgency, and pelvic pressure that started last night.  She has not had any fever.  She denies any nausea or vomiting.  She notes that she did end up with a kidney infection with her last UTI so she is trying to be proactive.  She did drink plenty of water last night which may have helped somewhat.  She is currently menstruating.  The history is provided by the patient.   History reviewed. No pertinent past medical history.  Patient Active Problem List   Diagnosis Date Noted   Bilateral neck pain 10/04/2020    Past Surgical History:  Procedure Laterality Date   INDUCED ABORTION      OB History     Gravida  4   Para  1   Term  1   Preterm      AB  3   Living  1      SAB  1   IAB  0   Ectopic      Multiple  0   Live Births  1            Home Medications    Prior to Admission medications   Medication Sig Start Date End Date Taking? Authorizing Provider  nitrofurantoin, macrocrystal-monohydrate, (MACROBID) 100 MG capsule Take 1 capsule (100 mg total) by mouth 2 (two) times daily. 08/31/21  Yes Francene Finders, PA-C  albuterol (VENTOLIN HFA) 108 (90 Base) MCG/ACT inhaler Inhale 1-2 puffs into the lungs every 6 (six) hours as needed for wheezing or shortness of breath. 05/28/21   Hazel Sams, PA-C  hydrocortisone 2.5 % cream Apply topically daily. 10/18/20   Nicolette Bang, MD  ondansetron (ZOFRAN ODT) 8 MG disintegrating tablet Take 1 tablet (8 mg total) by mouth every 8 (eight) hours as needed for nausea or vomiting. 05/28/21   Hazel Sams, PA-C  oseltamivir (TAMIFLU) 75 MG capsule Take 1 capsule (75 mg total) by mouth every 12 (twelve) hours. 05/28/21   Hazel Sams, PA-C   promethazine-dextromethorphan (PROMETHAZINE-DM) 6.25-15 MG/5ML syrup Take 5 mLs by mouth 4 (four) times daily as needed for cough. 05/28/21   Hazel Sams, PA-C  tiZANidine (ZANAFLEX) 4 MG tablet Take 4 mg by mouth every 6 (six) hours as needed for muscle spasms.    [provider]  ferrous sulfate 325 (65 FE) MG tablet Take 1 tablet (325 mg total) by mouth 3 (three) times daily with meals. 02/07/18 08/24/19  Diona Fanti, CNM  loratadine (CLARITIN) 10 MG tablet Take 1 tablet (10 mg total) by mouth daily. 03/05/18 08/24/19  Shambley, Melody N, CNM  ranitidine (ZANTAC) 150 MG tablet Take 1 tablet (150 mg total) by mouth 2 (two) times daily. Patient not taking: Reported on 02/04/2018 11/25/17 08/24/19  Joylene Igo, CNM    Family History Family History  Problem Relation Age of Onset   COPD Father     Social History Social History   Tobacco Use   Smoking status: Every Day    Packs/day: 0.25    Years: 10.00    Pack years: 2.50    Types: E-cigarettes, Cigarettes   Smokeless tobacco: Never  Vaping Use   Vaping Use: Some days  Substance Use Topics   Alcohol use: Yes    Alcohol/week: 6.0 standard drinks    Types: 6 Cans of beer per week   Drug use: No     Allergies   Banana   Review of Systems Review of Systems  Constitutional:  Negative for chills and fever.  Respiratory:  Negative for shortness of breath.   Gastrointestinal:  Negative for abdominal pain, nausea and vomiting.  Genitourinary:  Positive for dysuria and urgency.  Musculoskeletal:  Negative for back pain.    Physical Exam Triage Vital Signs ED Triage Vitals  Enc Vitals Group     BP      Pulse      Resp      Temp      Temp src      SpO2      Weight      Height      Head Circumference      Peak Flow      Pain Score      Pain Loc      Pain Edu?      Excl. in [place]?    No data found.  Updated Vital Signs BP 113/76 (BP Location: Left Arm)    Pulse 85    Temp 97.9 F (36.6 C)  (Oral)    Resp 18    LMP 08/29/2021 (Approximate)    SpO2 98%      Physical Exam Vitals and nursing note reviewed.  Constitutional:      General: She is not in acute distress.    Appearance: Normal appearance. She is not ill-appearing.  HENT:     Head: Normocephalic and atraumatic.     Nose: Nose normal.  Cardiovascular:     Rate and Rhythm: Normal rate.  Pulmonary:     Effort: Pulmonary effort is normal. No respiratory distress.  Skin:    General: Skin is warm and dry.  Neurological:     Mental Status: She is alert.  Psychiatric:        Mood and Affect: Mood normal.        Thought Content: Thought content normal.     UC Treatments / Results  Labs (all labs ordered are listed, but only abnormal results are displayed) Labs Reviewed  POCT URINALYSIS DIP (MANUAL ENTRY) - Abnormal; Notable for the following components:      Result Value   Blood, UA moderate (*)    Leukocytes, UA Small (1+) (*)    All other components within normal limits  URINE CULTURE    EKG   Radiology No results found.  Procedures Procedures (including critical care time)  Medications Ordered in UC Medications - No data to display  Initial Impression / Assessment and Plan / UC Course  I have reviewed the triage vital signs and the nursing notes.  Pertinent labs & imaging results that were available during my care of the patient were reviewed by me and considered in my medical decision making (see chart for details).    UA with positive LE noted, will treat to cover UTI with Macrobid and urine culture ordered.  Encouraged follow-up if symptoms fail to improve or worsen.  Final Clinical Impressions(s) / UC Diagnoses   Final diagnoses:  Dysuria  Acute cystitis without hematuria   Discharge Instructions   None    ED Prescriptions     Medication Sig Dispense Auth. Provider   nitrofurantoin, macrocrystal-monohydrate, (MACROBID) 100 MG  capsule Take 1 capsule (100 mg total) by mouth 2  (two) times daily. 10 capsule Francene Finders, PA-C      PDMP not reviewed this encounter.   Francene Finders, PA-C 08/31/21 1156

## 2021-08-31 NOTE — ED Triage Notes (Signed)
Pt c/o dysuria, pelvic pressure, sensation like needs to void.  Onset last night   Currently menstruating

## 2021-09-01 LAB — URINE CULTURE

## 2021-09-03 ENCOUNTER — Other Ambulatory Visit (HOSPITAL_COMMUNITY)
Admission: RE | Admit: 2021-09-03 | Discharge: 2021-09-03 | Disposition: A | Discharge status: Home / Self Care | Payer: Medicaid Other | Source: Ambulatory Visit | Attending: Physician Assistant | Admitting: Physician Assistant

## 2021-09-03 ENCOUNTER — Other Ambulatory Visit: Payer: Self-pay

## 2021-09-03 ENCOUNTER — Encounter: Payer: Self-pay | Admitting: Physician Assistant

## 2021-09-03 ENCOUNTER — Ambulatory Visit (INDEPENDENT_AMBULATORY_CARE_PROVIDER_SITE_OTHER): Payer: Medicaid Other | Admitting: Physician Assistant

## 2021-09-03 VITALS — BP 123/80 | HR 83 | Temp 98.2°F | Resp 18 | Ht 64.0 in | Wt 113.0 lb

## 2021-09-03 DIAGNOSIS — Z975 Presence of (intrauterine) contraceptive device: Secondary | ICD-10-CM | POA: Diagnosis not present

## 2021-09-03 DIAGNOSIS — R102 Pelvic and perineal pain: Secondary | ICD-10-CM | POA: Insufficient documentation

## 2021-09-03 DIAGNOSIS — Z113 Encounter for screening for infections with a predominantly sexual mode of transmission: Secondary | ICD-10-CM

## 2021-09-03 NOTE — Progress Notes (Signed)
Established Patient Office Visit  Subjective:  Patient ID: Vanessa Gallagher, female    DOB: Jun 05, 1991  Age: 31 y.o. MRN: [id number]  CC:  Chief Complaint  Patient presents with   Pelvic Pain    HPI Vanessa Gallagher states that she was seen at [facility] on August 31, 2021 and was diagnosed with urinary tract infection and was started on Macrobid.  States that she received a call today telling her that her urine culture showed "multiple species and recollection was needed.  Reports that she has been compliant to Combs, states that she did not take the morning dose after receiving the phone call.  Reports that she has noticed an improvement in with her dysuria, however in the last 2 days she noticed she was having some suprapubic pressure on both the right and left side.  Denies any vaginal discharge, but has had slight bleeding.  Reports recent IUD insertion on August 02, 2021, states this is her first IUD.  Reports that she has had slight bleeding since insertion.  Does request recheck of STI, is concerned for genital herpes, denies any known exposure , denies any genital lesions.  History reviewed. No pertinent past medical history.  Past Surgical History:  Procedure Laterality Date   INDUCED ABORTION      Family History  Problem Relation Age of Onset   COPD Father     Social History   Socioeconomic History   Marital status: Single    Spouse name: Not on file   Number of children: 1   Years of education: Not on file   Highest education level: Not on file  Occupational History   Not on file  Tobacco Use   Smoking status: Every Day    Packs/day: 0.25    Years: 10.00    Pack years: 2.50    Types: E-cigarettes, Cigarettes   Smokeless tobacco: Never  Vaping Use   Vaping Use: Every day  Substance and Sexual Activity   Alcohol use: Yes    Alcohol/week: 6.0 standard drinks    Types: 6 Cans of beer per week    Comment: weekends   Drug use: No   Sexual activity: Yes    Birth  control/protection: None  Other Topics Concern   Not on file  Social History Narrative   Not on file   Social Determinants of Health   Financial Resource Strain: Not on file  Food Insecurity: Not on file  Transportation Needs: Not on file  Physical Activity: Not on file  Stress: Not on file  Social Connections: Not on file  Intimate Partner Violence: Not on file    Outpatient Medications Prior to Visit  Medication Sig Dispense Refill   hydrocortisone 2.5 % cream Apply topically daily. 30 g 0   nitrofurantoin, macrocrystal-monohydrate, (MACROBID) 100 MG capsule Take 1 capsule (100 mg total) by mouth 2 (two) times daily. 10 capsule 0   albuterol (VENTOLIN HFA) 108 (90 Base) MCG/ACT inhaler Inhale 1-2 puffs into the lungs every 6 (six) hours as needed for wheezing or shortness of breath. 1 each 0   ondansetron (ZOFRAN ODT) 8 MG disintegrating tablet Take 1 tablet (8 mg total) by mouth every 8 (eight) hours as needed for nausea or vomiting. 20 tablet 0   oseltamivir (TAMIFLU) 75 MG capsule Take 1 capsule (75 mg total) by mouth every 12 (twelve) hours. 10 capsule 0   promethazine-dextromethorphan (PROMETHAZINE-DM) 6.25-15 MG/5ML syrup Take 5 mLs by mouth 4 (four) times daily as  needed for cough. 118 mL 0   tiZANidine (ZANAFLEX) 4 MG tablet Take 4 mg by mouth every 6 (six) hours as needed for muscle spasms.     No facility-administered medications prior to visit.    Allergies  Allergen Reactions   Avocado Itching   Banana Itching    ROS Review of Systems  Constitutional:  Negative for chills and fever.  HENT: Negative.    Cardiovascular:  Negative for chest pain.  Gastrointestinal:  Positive for abdominal pain. Negative for constipation, diarrhea, nausea and vomiting.  Endocrine: Negative.   Genitourinary:  Positive for pelvic pain and vaginal bleeding. Negative for dysuria, frequency and vaginal discharge.  Musculoskeletal:  Negative for back pain.  Skin: Negative.    Neurological: Negative.   Hematological: Negative.   Psychiatric/Behavioral: Negative.       Objective:    Physical Exam Vitals and nursing note reviewed.  Constitutional:      Appearance: Normal appearance.  HENT:     Head: Normocephalic and atraumatic.     Right Ear: External ear normal.     Left Ear: External ear normal.     Nose: Nose normal.     Mouth/Throat:     Mouth: Mucous membranes are moist.     Pharynx: Oropharynx is clear.  Eyes:     Extraocular Movements: Extraocular movements intact.     Conjunctiva/sclera: Conjunctivae normal.     Pupils: Pupils are equal, round, and reactive to light.  Cardiovascular:     Rate and Rhythm: Normal rate and regular rhythm.     Pulses: Normal pulses.     Heart sounds: Normal heart sounds.  Pulmonary:     Effort: Pulmonary effort is normal.     Breath sounds: Normal breath sounds.  Abdominal:     General: Bowel sounds are normal.     Tenderness: There is abdominal tenderness in the suprapubic area. There is no right CVA tenderness or left CVA tenderness.  Musculoskeletal:        General: Normal range of motion.     Cervical back: Normal range of motion and neck supple.  Skin:    General: Skin is warm and dry.  Neurological:     General: No focal deficit present.     Mental Status: She is alert and oriented to person, place, and time.  Psychiatric:        Mood and Affect: Mood normal.        Behavior: Behavior normal.        Thought Content: Thought content normal.        Judgment: Judgment normal.    BP 123/80 (BP Location: Left Arm, Patient Position: Sitting, Cuff Size: Normal)    Pulse 83    Temp 98.2 F (36.8 C) (Oral)    Resp 18    Ht 5\' 4"  (1.626 m)    Wt 113 lb (51.3 kg)    LMP 08/29/2021 (Approximate)    SpO2 99%    BMI 19.40 kg/m  Wt Readings from Last 3 Encounters:  09/03/21 113 lb (51.3 kg)  08/02/21 113 lb 9.6 oz (51.5 kg)  07/30/21 114 lb (51.7 kg)     Health Maintenance Due  Topic Date Due    COVID-19 Vaccine (2 - Moderna series) 04/10/2020   INFLUENZA VACCINE  02/12/2021    There are no preventive care reminders to display for this patient.  No results found for: TSH Lab Results  Component Value Date   WBC 5.5 07/21/2020  HGB 13.4 07/21/2020   HCT 39.5 07/21/2020   MCV 91 07/21/2020   PLT 196 07/21/2020   Lab Results  Component Value Date   NA 137 07/21/2020   K 3.3 (L) 07/21/2020   CO2 25 07/21/2020   GLUCOSE 112 (H) 07/21/2020   BUN 12 07/21/2020   CREATININE 0.91 07/21/2020   BILITOT 1.2 07/21/2020   ALKPHOS 59 07/21/2020   AST 26 07/21/2020   ALT 14 07/21/2020   PROT 7.8 07/21/2020   ALBUMIN 5.0 07/21/2020   CALCIUM 9.7 07/21/2020   ANIONGAP 7 01/25/2018   Lab Results  Component Value Date   CHOL 180 07/21/2020   Lab Results  Component Value Date   HDL 62 07/21/2020   Lab Results  Component Value Date   LDLCALC 99 07/21/2020   Lab Results  Component Value Date   TRIG 104 07/21/2020   Lab Results  Component Value Date   CHOLHDL 2.9 07/21/2020   No results found for: HGBA1C    Assessment & Plan:   Problem List Items Addressed This Visit       Other   Pelvic pressure in female - Primary   Relevant Orders   Cervicovaginal ancillary only   Other Visit Diagnoses     IUD (intrauterine device) in place       Routine screening for STI (sexually transmitted infection)       Relevant Orders   HSV(herpes simplex vrs) 1+2 ab-IgG   HIV antibody (with reflex)   RPR       No orders of the defined types were placed in this encounter. 1. Pelvic pressure in female Encourage patient to continue antibiotic prescribed by urgent care, continue increased hydration, rest.  Patient encouraged to follow-up with provider who placed IUD if symptoms continue without explanation.  Did not repeat urine culture today, patient has had 3 days of antibiotic treatment, results would be skewed. - Cervicovaginal ancillary only  2. Routine screening for  STI (sexually transmitted infection)  - HSV(herpes simplex vrs) 1+2 ab-IgG - HIV antibody (with reflex) - RPR   I have reviewed the patient's medical history (PMH, PSH, Social History, Family History, Medications, and allergies) , and have been updated if relevant. I spent 20 minutes reviewing chart and  face to face time with patient.   Follow-up: Return if symptoms worsen or fail to improve.    Loraine Grip Mayers, PA-C

## 2021-09-03 NOTE — Patient Instructions (Signed)
I do encourage you to continue the antibiotic as prescribed by urgent care.  Make sure that you are getting plenty of rest and drinking lots of water.  We will call you with the lab results as soon as they are available.   Roney Jaffe, PA-C Physician Assistant [facility] [email or web address]   Pelvic Pain, Female Pelvic pain is pain in your lower abdomen, below your belly button and between your hips. The pain may start suddenly (be acute), keep coming back (be recurring), or last a long time (become chronic). Pelvic pain that lasts longer than 6 months is considered chronic. Pelvic pain may affect your: Reproductive organs. Urinary system. Digestive tract. Musculoskeletal system. There are many potential causes of pelvic pain. Sometimes, the pain can be a result of digestive or urinary conditions, strained muscles or ligaments, or reproductive conditions. Sometimes the cause of pelvic pain is not known. Follow these instructions at home:  Take over-the-counter and prescription medicines only as told by your health care provider. Rest as told by your health care provider. Do not have sex if it hurts. Keep a journal of your pelvic pain. Write down: When the pain started. Where the pain is located. What seems to make the pain better or worse, such as food or your monthly period (menstrual cycle). Any symptoms you have along with the pain. Keep all follow-up visits. This is important. Contact a health care provider if: Medicine does not help your pain, or your pain comes back. You have new symptoms. You have abnormal vaginal discharge or bleeding, including bleeding after menopause. You have a fever or chills. You are constipated. You have blood in your urine or stool (feces). You have foul-smelling urine. You feel weak or light-headed. Get help right away if: You have sudden severe pain. Your pain gets steadily  worse. You have severe pain along with fever, nausea, vomiting, or excessive sweating. You lose consciousness. These symptoms may represent a serious problem that is an emergency. Do not wait to see if the symptoms will go away. Get medical help right away. Call your local emergency services (911 in the [place] Do not drive yourself to the hospital. Summary Pelvic pain is pain in your lower abdomen, below your belly button and between your hips. There are many potential causes of pelvic pain. Keep a journal of your pelvic pain. This information is not intended to replace advice given to you by your health care provider. Make sure you discuss any questions you have with your health care provider. Document Revised: 11/07/2020 Document Reviewed: 11/07/2020 Elsevier Patient Education  2022 [employer].

## 2021-09-03 NOTE — Progress Notes (Signed)
Patient reports pelvic pain beginning 7 days ago. Patient reports recent IUD insertion. Patient has had coffee today and patient has not taken any medication today.

## 2021-09-04 ENCOUNTER — Encounter: Payer: Self-pay | Admitting: Physician Assistant

## 2021-09-04 ENCOUNTER — Telehealth: Payer: Self-pay | Admitting: *Deleted

## 2021-09-04 LAB — CERVICOVAGINAL ANCILLARY ONLY
Bacterial Vaginitis (gardnerella): NEGATIVE
Candida Glabrata: NEGATIVE
Candida Vaginitis: NEGATIVE
Chlamydia: NEGATIVE
Comment: NEGATIVE
Comment: NEGATIVE
Comment: NEGATIVE
Comment: NEGATIVE
Comment: NEGATIVE
Comment: NORMAL
Neisseria Gonorrhea: NEGATIVE
Trichomonas: NEGATIVE

## 2021-09-04 LAB — RPR: RPR Ser Ql: NONREACTIVE

## 2021-09-04 LAB — HSV(HERPES SIMPLEX VRS) I + II AB-IGG
HSV 1 Glycoprotein G Ab, IgG: <0.91 index (ref 0.00–0.90)
HSV 2 IgG, Type Spec: <0.91 index (ref 0.00–0.90)

## 2021-09-04 LAB — HIV ANTIBODY (ROUTINE TESTING W REFLEX): HIV Screen 4th Generation wRfx: NONREACTIVE

## 2021-09-04 NOTE — Telephone Encounter (Signed)
Patient verified DOB Patient is aware of results being negative. Patient states pressure is still present. Patient plans to keep OB/GYN appointment on 2/23 to have her IUD removed and provide another UA.

## 2021-09-06 ENCOUNTER — Encounter: Payer: Self-pay | Admitting: Obstetrics and Gynecology

## 2021-09-06 ENCOUNTER — Other Ambulatory Visit: Payer: Self-pay

## 2021-09-06 ENCOUNTER — Ambulatory Visit (INDEPENDENT_AMBULATORY_CARE_PROVIDER_SITE_OTHER): Payer: Medicaid Other | Admitting: Obstetrics and Gynecology

## 2021-09-06 VITALS — BP 114/78 | HR 104 | Ht 64.0 in | Wt 115.2 lb

## 2021-09-06 DIAGNOSIS — Z30432 Encounter for removal of intrauterine contraceptive device: Secondary | ICD-10-CM

## 2021-09-06 DIAGNOSIS — Z308 Encounter for other contraceptive management: Secondary | ICD-10-CM

## 2021-09-06 DIAGNOSIS — Z3040 Encounter for surveillance of contraceptives, unspecified: Secondary | ICD-10-CM

## 2021-09-06 MED ORDER — NORELGESTROMIN-ETH ESTRADIOL 150-35 MCG/24HR TD PTWK: 1.0000 | MEDICATED_PATCH | TRANSDERMAL | 12 refills | Status: DC

## 2021-09-06 NOTE — Progress Notes (Signed)
° °  IUD Removal Procedure Note   Patient is 31 y.o. [phone] who is here for Paragard IUD removal. She would like IUD removed secondary to feeling like it caused UTI and she thinks it will continue to happen. She also reports that "it just freaks me out." She has had issues with IUD as previously explained. She was seen [facility] understands that she could get pregnant after removal of IUD if she does not use another form of contraception. She has no other complaints today. Reviewed risks of removal including pain, bleeding, difficult removal and inability to remove IUD which may require surgical removal in OR. She affirms that she would like IUD removed.  BP 114/78 (BP Location: Left Arm, Patient Position: Sitting, Cuff Size: Normal)    Pulse (!) 104    Ht 5\' 4"  (1.626 m)    Wt 115 lb 3.2 oz (52.3 kg)    LMP 08/29/2021 (Approximate)    BMI 19.77 kg/m   Patient with normal appearing external female genitalia. Graves speculum placed in vagina and Paragard IUD strings easily visualized. Strings grasped with ring forceps and removed easily. Minimal bleeding noted. All instruments removed from vagina. Patient tolerated procedure very well.    She was given post removal instructions. She is planning on using Xulane patch for contraception.   Return 1 year for annual or prn.  Total time spent face-to-face in counseling and the procedure was 15 mins.   08/31/2021, MSN, CNM 09/06/2021 3:16 PM

## 2021-09-07 ENCOUNTER — Encounter: Payer: Self-pay | Admitting: Obstetrics and Gynecology

## 2021-11-07 NOTE — Progress Notes (Signed)
? ? ?Patient ID: Vanessa Gallagher, female    DOB: [birth year]  MRN: [id number] ? ?CC: Neck Pain ? ?Subjective: ?Vanessa Gallagher is a 31 y.o. female who presents for neck pain.  ? ?Her concerns today include:  ?Neck pain follow-up: ?10/18/2020 with Marcy Siren, DO: ?Suspect muscle strain. Neg cervical spine x-ray. Discussed supportive care measures at home. Will place referral for PT.  ?- Ambulatory referral to Physical Therapy ? ?11/09/2021: ?Reports posterior bilateral neck pain persisting. Reports feels pressure and throbbing. Taking over-the-counter Tylenol and Ibuprofen helps some. Was taking muscle relaxer in the past helped. She is aware not take muscle relaxer when operating machinery, driving, or at work. 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 Sitting for extended periods of time makes worse. States "I feel like my head is heavier than my body." Has no difficulty turning left to right. Pain increases with extension and flexion of neck. Denies recent injury and trauma. Denies additional red flag symptoms. Did not go to physical therapy referral reporting neck pain seemed to get a little better.  ? ? ?Patient Active Problem List  ? Diagnosis Date Noted  ? Pelvic pressure in female 09/03/2021  ? Bilateral neck pain 10/04/2020  ?  ? ?Current Outpatient Medications on File Prior to Visit  ?Medication Sig Dispense Refill  ? hydrocortisone 2.5 % cream Apply topically daily. 30 g 0  ? norelgestromin-ethinyl estradiol [facility]) 150-35 MCG/24HR transdermal patch Place 1 patch onto the skin once a week. 3 patch 12  ? [DISCONTINUED] ferrous sulfate 325 (65 FE) MG tablet Take 1 tablet (325 mg total) by mouth 3 (three) times daily with meals. 90 tablet 3  ? [DISCONTINUED] loratadine (CLARITIN) 10 MG tablet Take 1 tablet (10 mg total) by mouth daily. 30 tablet 2  ? [DISCONTINUED] ranitidine (ZANTAC) 150 MG tablet Take 1 tablet (150 mg total) by mouth 2 (two) times daily. (Patient not taking: Reported on  02/04/2018) 60 tablet 4  ? ?No current facility-administered medications on file prior to visit.  ? ? ?Allergies  ?Allergen Reactions  ? Avocado Itching  ? Banana Itching  ? ? ?Social History  ? ?Socioeconomic History  ? Marital status: Single  ?  Spouse name: Not on file  ? Number of children: 1  ? Years of education: Not on file  ? Highest education level: Not on file  ?Occupational History  ? Not on file  ?Tobacco Use  ? Smoking status: Every Day  ?  Packs/day: 0.25  ?  Years: 10.00  ?  Pack years: 2.50  ?  Types: E-cigarettes, Cigarettes  ?  Passive exposure: Current  ? Smokeless tobacco: Never  ?Vaping Use  ? Vaping Use: Every day  ?Substance and Sexual Activity  ? Alcohol use: Yes  ?  Alcohol/week: 6.0 standard drinks  ?  Types: 6 Cans of beer per week  ?  Comment: weekends  ? Drug use: No  ? Sexual activity: Yes  ?  Birth control/protection: Patch  ?Other Topics Concern  ? Not on file  ?Social History Narrative  ? Not on file  ? ?Social Determinants of Health  ? ?Financial Resource Strain: Not on file  ?Food Insecurity: Not on file  ?Transportation Needs: Not on file  ?Physical Activity: Not on file  ?Stress: Not on file  ?Social Connections: Not on file  ?Intimate Partner Violence: Not on file  ? ? ?Family History  ?Problem Relation Age of Onset  ? COPD Father   ? ? ?  Past Surgical History:  ?Procedure Laterality Date  ? INDUCED ABORTION    ? ? ?ROS: ?Review of Systems ?Negative except as stated above ? ?PHYSICAL EXAM: ?BP 121/78 (BP Location: Left Arm, Patient Position: Sitting, Cuff Size: Normal)   Pulse 68   Temp 98.3 ?F (36.8 ?C)   Resp 18   Ht 5' 4.02" (1.626 m)   Wt 115 lb (52.2 kg)   SpO2 98%   BMI 19.73 kg/m?  ? ?Physical Exam ?HENT:  ?   Head: Normocephalic and atraumatic.  ?Eyes:  ?   Extraocular Movements: Extraocular movements intact.  ?   Conjunctiva/sclera: Conjunctivae normal.  ?   Pupils: Pupils are equal, round, and reactive to light.  ?Cardiovascular:  ?   Rate and Rhythm: Normal rate  and regular rhythm.  ?   Pulses: Normal pulses.  ?   Heart sounds: Normal heart sounds.  ?Pulmonary:  ?   Effort: Pulmonary effort is normal.  ?   Breath sounds: Normal breath sounds.  ?Musculoskeletal:  ?   Cervical back: Normal range of motion and neck supple.  ?Neurological:  ?   General: No focal deficit present.  ?   Mental Status: She is alert and oriented to person, place, and time.  ?Psychiatric:     ?   Mood and Affect: Mood normal.     ?   Behavior: Behavior normal.  ? ? ?ASSESSMENT AND PLAN: ?1. Chronic neck pain: ?- Cyclobenzaprine as prescribed. Counseled on medication adherence and adverse effects. May cause drowsiness. Counseled patient to not consume if operating heavy machinery, driving, or working. Counseled patient to not consume with alcohol or illicit substances. Patient verbalized understanding.  ?- Continue over-the-counter Acetaminophen and Ibuprofen.  ?- Referral to Orthopedic Surgery for further evaluation and management.  ?- cyclobenzaprine (FLEXERIL) 5 MG tablet; Take 1 tablet (5 mg total) by mouth 3 (three) times daily as needed for muscle spasms.  Dispense: 30 tablet; Refill: 1 ?- Ambulatory referral to Orthopedic Surgery ? ? ? ? ?Patient was given the opportunity to ask questions.  Patient verbalized understanding of the plan and was able to repeat key elements of the plan. Patient was given clear instructions to go to Emergency Department or return to medical center if symptoms don't improve, worsen, or new problems develop.The patient verbalized understanding. ? ? ?Orders Placed This Encounter  ?Procedures  ? Ambulatory referral to Orthopedic Surgery  ? ? ? ?Requested Prescriptions  ? ?Signed Prescriptions Disp Refills  ? cyclobenzaprine (FLEXERIL) 5 MG tablet 30 tablet 1  ?  Sig: Take 1 tablet (5 mg total) by mouth 3 (three) times daily as needed for muscle spasms.  ? ? ?Follow-up with primary provider as scheduled.  ? ?Rema Fendt, NP  ?

## 2021-11-09 ENCOUNTER — Encounter: Payer: Self-pay | Admitting: Family

## 2021-11-09 ENCOUNTER — Ambulatory Visit (INDEPENDENT_AMBULATORY_CARE_PROVIDER_SITE_OTHER): Payer: Medicaid Other | Admitting: Family

## 2021-11-09 VITALS — BP 121/78 | HR 68 | Temp 98.3°F | Resp 18 | Ht 64.02 in | Wt 115.0 lb

## 2021-11-09 DIAGNOSIS — M542 Cervicalgia: Secondary | ICD-10-CM | POA: Diagnosis not present

## 2021-11-09 DIAGNOSIS — G8929 Other chronic pain: Secondary | ICD-10-CM | POA: Diagnosis not present

## 2021-11-09 MED ORDER — CYCLOBENZAPRINE HCL 5 MG PO TABS: 5.0000 mg | ORAL_TABLET | Freq: Three times a day (TID) | ORAL | 1 refills | Status: DC | PRN

## 2021-11-09 NOTE — Progress Notes (Signed)
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 ? ?

## 2021-12-06 ENCOUNTER — Ambulatory Visit: Payer: Self-pay

## 2021-12-06 ENCOUNTER — Other Ambulatory Visit: Payer: Self-pay | Admitting: Family Medicine

## 2021-12-06 ENCOUNTER — Ambulatory Visit (INDEPENDENT_AMBULATORY_CARE_PROVIDER_SITE_OTHER): Payer: Medicaid Other | Admitting: Surgery

## 2021-12-06 ENCOUNTER — Encounter: Payer: Self-pay | Admitting: Surgery

## 2021-12-06 ENCOUNTER — Telehealth: Payer: Self-pay | Admitting: Family Medicine

## 2021-12-06 VITALS — BP 132/88 | HR 90 | Ht 64.02 in | Wt 115.0 lb

## 2021-12-06 DIAGNOSIS — M542 Cervicalgia: Secondary | ICD-10-CM

## 2021-12-06 DIAGNOSIS — N63 Unspecified lump in unspecified breast: Secondary | ICD-10-CM

## 2021-12-06 NOTE — Telephone Encounter (Signed)
[facility] needs the order for the pt's upcoming mammogram sent to them.  Pt has appt 5/30, but they need to order, previously done by Dr Earlene Plater.  They have changed dr to Dr Andrey Campanile, since Valentino Nose no longer her dr.

## 2021-12-06 NOTE — Progress Notes (Signed)
Office Visit Note   Patient: Vanessa Gallagher           Date of Birth: 06-03-1991           MRN: [id number] Visit Date: 12/06/2021              Requested by: Camillia Herter, NP [id number] [place],  [place] [address] PCP: Dorna Mai, MD   Assessment & Plan: Visit Diagnoses:  1. Neck pain     Plan: With patient's ongoing issue I advised that I also recommend trying formal PT.  They can evaluate and treat as needed.  Modalities.  Since she has really not been consistent with ibuprofen I recommended she also try 600 to 800 mg twice daily with food.  Follow-up with me in 4 weeks for recheck.  We will make decisions at that time as whether not further imaging studies are needed.    Follow-Up Instructions: Return in about 4 weeks (around 01/03/2022) for with Eri Platten recheck neck pain.   Orders:  Orders Placed This Encounter  Procedures   XR Cervical Spine 2 or 3 views   Ambulatory referral to Physical Therapy   No orders of the defined types were placed in this encounter.     Procedures: No procedures performed   Clinical Data: No additional findings.   Subjective: Chief Complaint  Patient presents with   Neck - Pain    HPI 31 year old white female comes in today with complaints of chronic neck pain.  States that she had off-and-on pain for about a year.  No injury that she can recall.  Describes having pain at the base of her skull and this extends to the left greater than right trapezius muscles.  States that she has taken ibuprofen for this but her last dose was a few days ago.  PCP has also prescribed Flexeril and states that this makes her sleepy.  PCP is also ordered formal PT but patient states that she has not been able to go due to her work schedule and taking care of her young child.  Denies any upper extremity radicular symptoms.  She is employed with [employer] as a Geophysicist/field seismologist and states that this does aggravate her neck pain. Review of Systems No current  cardiopulmonary GI/GU issues  Objective: Vital Signs: BP 132/88   Pulse 90   Ht 5' 4.02" (1.626 m)   Wt 115 lb (52.2 kg)   BMI 19.73 kg/m   Physical Exam HENT:     Head: Normocephalic and atraumatic.  Eyes:     Extraocular Movements: Extraocular movements intact.  Pulmonary:     Effort: No respiratory distress.  Musculoskeletal:     Comments: Cervical spine good range of motion.  No brachial plexus tenderness.  She has left greater than right trapezius tenderness and this extends to the base of her skull.  Bilateral shoulders unremarkable.  No focal motor deficits.  Neurological:     Mental Status: She is alert.  Psychiatric:        Mood and Affect: Mood normal.    Ortho Exam  Specialty Comments:  No specialty comments available.  Imaging: No results found.   PMFS History: Patient Active Problem List   Diagnosis Date Noted   Pelvic pressure in female 09/03/2021   Bilateral neck pain 10/04/2020   History reviewed. No pertinent past medical history.  Family History  Problem Relation Age of Onset   COPD Father     Past Surgical History:  Procedure Laterality Date   INDUCED ABORTION     Social History   Occupational History   Not on file  Tobacco Use   Smoking status: Every Day    Packs/day: 0.25    Years: 10.00    Pack years: 2.50    Types: E-cigarettes, Cigarettes    Passive exposure: Current   Smokeless tobacco: Never  Vaping Use   Vaping Use: Every day  Substance and Sexual Activity   Alcohol use: Yes    Alcohol/week: 6.0 standard drinks    Types: 6 Cans of beer per week    Comment: weekends   Drug use: No   Sexual activity: Yes    Birth control/protection: Patch

## 2021-12-11 ENCOUNTER — Other Ambulatory Visit: Payer: Medicaid Other

## 2022-01-03 ENCOUNTER — Ambulatory Visit: Payer: Medicaid Other | Admitting: Surgery

## 2022-01-04 ENCOUNTER — Telehealth: Payer: Medicaid Other | Admitting: Physician Assistant

## 2022-01-04 DIAGNOSIS — R21 Rash and other nonspecific skin eruption: Secondary | ICD-10-CM

## 2022-01-04 DIAGNOSIS — B029 Zoster without complications: Secondary | ICD-10-CM | POA: Diagnosis not present

## 2022-01-04 MED ORDER — TRIAMCINOLONE ACETONIDE 0.1 % EX CREA: 1.0000 | TOPICAL_CREAM | Freq: Two times a day (BID) | CUTANEOUS | 0 refills | Status: DC

## 2022-01-04 MED ORDER — VALACYCLOVIR HCL 1 G PO TABS: 1000.0000 mg | ORAL_TABLET | Freq: Three times a day (TID) | ORAL | 0 refills | Status: AC

## 2022-01-04 NOTE — Progress Notes (Signed)
Virtual Visit Consent   Vanessa Gallagher, you are scheduled for a virtual visit with a [facility] provider today. Just as with appointments in the office, your consent must be obtained to participate. Your consent will be active for this visit and any virtual visit you may have with one of our providers in the next 365 days. If you have a MyChart account, a copy of this consent can be sent to you electronically.  As this is a virtual visit, video technology does not allow for your provider to perform a traditional examination. This may limit your provider's ability to fully assess your condition. If your provider identifies any concerns that need to be evaluated in person or the need to arrange testing (such as labs, EKG, etc.), we will make arrangements to do so. Although advances in technology are sophisticated, we cannot ensure that it will always work on either your end or our end. If the connection with a video visit is poor, the visit may have to be switched to a telephone visit. With either a video or telephone visit, we are not always able to ensure that we have a secure connection.  By engaging in this virtual visit, you consent to the provision of healthcare and authorize for your insurance to be billed (if applicable) for the services provided during this visit. Depending on your insurance coverage, you may receive a charge related to this service.  I need to obtain your verbal consent now. Are you willing to proceed with your visit today? Vanessa Gallagher has provided verbal consent on 01/04/2022 for a virtual visit (video or telephone). Margaretann Loveless, PA-C  Date: 01/04/2022 5:30 PM  Virtual Visit via Video Note   IMargaretann Loveless, connected with  Vanessa Gallagher  ([id number], 1990/10/17) on 01/04/22 at  5:30 PM EDT by a video-enabled telemedicine application and verified that I am speaking with the correct person using two identifiers.  Location: Patient: Virtual Visit Location  Patient: Mobile Provider: Virtual Visit Location Provider: Home Office   I discussed the limitations of evaluation and management by telemedicine and the availability of in person appointments. The patient expressed understanding and agreed to proceed.    History of Present Illness: Vanessa Gallagher is a 31 y.o. who identifies as a female who was assigned female at birth, and is being seen today for rash.  HPI: Rash This is a new problem. The affected locations include the back (at bra line). The rash is characterized by itchiness, blistering and redness. It is unknown if there was an exposure to a precipitant. Pertinent negatives include no congestion, cough, facial edema, fever, shortness of breath or sore throat. Past treatments include nothing. The treatment provided no relief. Her past medical history is significant for allergies (food).      Problems:  Patient Active Problem List   Diagnosis Date Noted   Pelvic pressure in female 09/03/2021   Bilateral neck pain 10/04/2020    Allergies:  Allergies  Allergen Reactions   Avocado Itching   Banana Itching   Medications:  Current Outpatient Medications:    triamcinolone cream (KENALOG) 0.1 %, Apply 1 Application topically 2 (two) times daily., Disp: 30 g, Rfl: 0   valACYclovir (VALTREX) 1000 MG tablet, Take 1 tablet (1,000 mg total) by mouth 3 (three) times daily for 7 days., Disp: 21 tablet, Rfl: 0   cyclobenzaprine (FLEXERIL) 5 MG tablet, Take 1 tablet (5 mg total) by mouth 3 (three) times daily as  needed for muscle spasms., Disp: 30 tablet, Rfl: 1   hydrocortisone 2.5 % cream, Apply topically daily., Disp: 30 g, Rfl: 0   norelgestromin-ethinyl estradiol (XULANE) 150-35 MCG/24HR transdermal patch, Place 1 patch onto the skin once a week., Disp: 3 patch, Rfl: 12  Observations/Objective: Patient is well-developed, well-nourished in no acute distress.  Resting comfortably  Head is normocephalic, atraumatic.  No labored breathing.   Speech is clear and coherent with logical content.  Patient is alert and oriented at baseline.  Cluster of red macules on right back/flank at bra-line that are well-demarcated, appear slightly raised, pruritic, burning  Assessment and Plan: 1. Herpes zoster without complication - valACYclovir (VALTREX) 1000 MG tablet; Take 1 tablet (1,000 mg total) by mouth 3 (three) times daily for 7 days.  Dispense: 21 tablet; Refill: 0  2. Rash - triamcinolone cream (KENALOG) 0.1 %; Apply 1 Application topically 2 (two) times daily.  Dispense: 30 g; Refill: 0  - Suspect more likely urticarial type rash based on appearance - Triamcinolone cream can be used over affected area twice daily - Will also treat with Valtrex for possible shingles since having some burning, and has a history of shingles at age 23 and rash is unilateral - Luke warm to cool showers to avoid worsening itch - Cool compresses - Seek in person evaluation if worsening or fails to improve  Follow Up Instructions: I discussed the assessment and treatment plan with the patient. The patient was provided an opportunity to ask questions and all were answered. The patient agreed with the plan and demonstrated an understanding of the instructions.  A copy of instructions were sent to the patient via MyChart unless otherwise noted below.    The patient was advised to call back or seek an in-person evaluation if the symptoms worsen or if the condition fails to improve as anticipated.  Time:  I spent 10 minutes with the patient via telehealth technology discussing the above problems/concerns.    Margaretann Loveless, PA-C

## 2022-01-08 ENCOUNTER — Telehealth: Payer: Medicaid Other | Admitting: Physician Assistant

## 2022-01-08 DIAGNOSIS — B9689 Other specified bacterial agents as the cause of diseases classified elsewhere: Secondary | ICD-10-CM

## 2022-01-08 DIAGNOSIS — J019 Acute sinusitis, unspecified: Secondary | ICD-10-CM | POA: Diagnosis not present

## 2022-01-08 MED ORDER — AMOXICILLIN-POT CLAVULANATE 875-125 MG PO TABS: 1.0000 | ORAL_TABLET | Freq: Two times a day (BID) | ORAL | 0 refills | Status: DC

## 2022-01-11 ENCOUNTER — Telehealth: Payer: Medicaid Other | Admitting: Physician Assistant

## 2022-01-11 DIAGNOSIS — B379 Candidiasis, unspecified: Secondary | ICD-10-CM

## 2022-01-11 DIAGNOSIS — T3695XA Adverse effect of unspecified systemic antibiotic, initial encounter: Secondary | ICD-10-CM

## 2022-01-11 MED ORDER — FLUCONAZOLE 150 MG PO TABS: 150.0000 mg | ORAL_TABLET | ORAL | 0 refills | Status: DC | PRN

## 2022-01-11 NOTE — Patient Instructions (Signed)
Vanessa Gallagher, thank you for joining Margaretann Loveless, PA-C for today's virtual visit.  While this provider is not your primary care provider (PCP), if your PCP is located in our provider database this encounter information will be shared with them immediately following your visit.  Consent: (Patient) Vanessa Gallagher provided verbal consent for this virtual visit at the beginning of the encounter.  Current Medications:  Current Outpatient Medications:    fluconazole (DIFLUCAN) 150 MG tablet, Take 1 tablet (150 mg total) by mouth every 3 (three) days as needed., Disp: 3 tablet, Rfl: 0   amoxicillin-clavulanate (AUGMENTIN) 875-125 MG tablet, Take 1 tablet by mouth 2 (two) times daily., Disp: 14 tablet, Rfl: 0   cyclobenzaprine (FLEXERIL) 5 MG tablet, Take 1 tablet (5 mg total) by mouth 3 (three) times daily as needed for muscle spasms., Disp: 30 tablet, Rfl: 1   hydrocortisone 2.5 % cream, Apply topically daily., Disp: 30 g, Rfl: 0   norelgestromin-ethinyl estradiol (XULANE) 150-35 MCG/24HR transdermal patch, Place 1 patch onto the skin once a week., Disp: 3 patch, Rfl: 12   triamcinolone cream (KENALOG) 0.1 %, Apply 1 Application topically 2 (two) times daily., Disp: 30 g, Rfl: 0   valACYclovir (VALTREX) 1000 MG tablet, Take 1 tablet (1,000 mg total) by mouth 3 (three) times daily for 7 days., Disp: 21 tablet, Rfl: 0   Medications ordered in this encounter:  Meds ordered this encounter  Medications   fluconazole (DIFLUCAN) 150 MG tablet    Sig: Take 1 tablet (150 mg total) by mouth every 3 (three) days as needed.    Dispense:  3 tablet    Refill:  0    Order Specific Question:   Supervising Provider    Answer:   Hyacinth Meeker, BRIAN [[id number]]     *If you need refills on other medications prior to your next appointment, please contact your pharmacy*  Follow-Up: Call back or seek an in-person evaluation if the symptoms worsen or if the condition fails to improve as anticipated.  Other  Instructions Vaginal Yeast Infection, Adult  Vaginal yeast infection is a condition that causes vaginal discharge as well as soreness, swelling, and redness (inflammation) of the vagina. This is a common condition. Some women get this infection frequently. What are the causes? This condition is caused by a change in the normal balance of the yeast (Candida) and normal bacteria that live in the vagina. This change causes an overgrowth of yeast, which causes the inflammation. What increases the risk? The condition is more likely to develop in women who: Take antibiotic medicines. Have diabetes. Take birth control pills. Are pregnant. Douche often. Have a weak body defense system (immune system). Have been taking steroid medicines for a long time. Frequently wear tight clothing. What are the signs or symptoms? Symptoms of this condition include: White, thick, creamy vaginal discharge. Swelling, itching, redness, and irritation of the vagina. The lips of the vagina (labia) may be affected as well. Pain or a burning feeling while urinating. Pain during sex. How is this diagnosed? This condition is diagnosed based on: Your medical history. A physical exam. A pelvic exam. Your health care provider will examine a sample of your vaginal discharge under a microscope. Your health care provider may send this sample for testing to confirm the diagnosis. How is this treated? This condition is treated with medicine. Medicines may be over-the-counter or prescription. You may be told to use one or more of the following: Medicine that is taken by  mouth (orally). Medicine that is applied as a cream (topically). Medicine that is inserted directly into the vagina (suppository). Follow these instructions at home: Take or apply over-the-counter and prescription medicines only as told by your health care provider. Do not use tampons until your health care provider approves. Do not have sex until your  infection has cleared. Sex can prolong or worsen your symptoms of infection. Ask your health care provider when it is safe to resume sexual activity. Keep all follow-up visits. This is important. How is this prevented?  Do not wear tight clothes, such as pantyhose or tight pants. Wear breathable cotton underwear. Do not use douches, perfumed soap, creams, or powders. Wipe from front to back after using the toilet. If you have diabetes, keep your blood sugar levels under control. Ask your health care provider for other ways to prevent yeast infections. Contact a health care provider if: You have a fever. Your symptoms go away and then return. Your symptoms do not get better with treatment. Your symptoms get worse. You have new symptoms. You develop blisters in or around your vagina. You have blood coming from your vagina and it is not your menstrual period. You develop pain in your abdomen. Summary Vaginal yeast infection is a condition that causes discharge as well as soreness, swelling, and redness (inflammation) of the vagina. This condition is treated with medicine. Medicines may be over-the-counter or prescription. Take or apply over-the-counter and prescription medicines only as told by your health care provider. Do not douche. Resume sexual activity or use of tampons as instructed by your health care provider. Contact a health care provider if your symptoms do not get better with treatment or your symptoms go away and then return. This information is not intended to replace advice given to you by your health care provider. Make sure you discuss any questions you have with your health care provider. Document Revised: 09/18/2020 Document Reviewed: 09/18/2020 Elsevier Patient Education  2023 [employer]vier Inc[employer]    If you have been instructed to have an in-person evaluation today at a local Urgent Care facility, please use the link below. It will take you to a list of all of our available  [facility], including address, phone number and hours of operation. Please do not delay care.  [facility]  If you or a family member do not have a primary care provider, use the link below to schedule a visit and establish care. When you choose a [facility] primary care physician or advanced practice provider, you gain a long-term partner in health. Find a Primary Care Provider  Learn more about [facility]'s in-office and virtual care options: [facility] - Get Care Now

## 2022-01-11 NOTE — Progress Notes (Signed)
Virtual Visit Consent   Vanessa Gallagher, you are scheduled for a virtual visit with a [facility] provider today. Just as with appointments in the office, your consent must be obtained to participate. Your consent will be active for this visit and any virtual visit you may have with one of our providers in the next 365 days. If you have a MyChart account, a copy of this consent can be sent to you electronically.  As this is a virtual visit, video technology does not allow for your provider to perform a traditional examination. This may limit your provider's ability to fully assess your condition. If your provider identifies any concerns that need to be evaluated in person or the need to arrange testing (such as labs, EKG, etc.), we will make arrangements to do so. Although advances in technology are sophisticated, we cannot ensure that it will always work on either your end or our end. If the connection with a video visit is poor, the visit may have to be switched to a telephone visit. With either a video or telephone visit, we are not always able to ensure that we have a secure connection.  By engaging in this virtual visit, you consent to the provision of healthcare and authorize for your insurance to be billed (if applicable) for the services provided during this visit. Depending on your insurance coverage, you may receive a charge related to this service.  I need to obtain your verbal consent now. Are you willing to proceed with your visit today? Vanessa Gallagher has provided verbal consent on 01/11/2022 for a virtual visit (video or telephone). Margaretann Loveless, PA-C  Date: 01/11/2022 8:22 AM  Virtual Visit via Video Note   I, Margaretann Loveless, connected with  Vanessa Gallagher  ([id number], 1990-10-14) on 01/11/22 at  8:15 AM EDT by a video-enabled telemedicine application and verified that I am speaking with the correct person using two identifiers.  Location: Patient: Virtual Visit Location  Patient: Home Provider: Virtual Visit Location Provider: Home Office   I discussed the limitations of evaluation and management by telemedicine and the availability of in person appointments. The patient expressed understanding and agreed to proceed.    History of Present Illness: Vanessa Gallagher is a 31 y.o. who identifies as a female who was assigned female at birth, and is being seen today for possible yeast infection.  HPI: Vaginal Discharge The patient's primary symptoms include genital itching and vaginal discharge. The patient's pertinent negatives include no genital lesions, genital odor, genital rash or pelvic pain. This is a new problem. The current episode started yesterday. The problem occurs constantly. The problem has been gradually worsening. The patient is experiencing no pain. She is not pregnant. Pertinent negatives include no abdominal pain, back pain, chills, fever, flank pain or nausea. The vaginal discharge was thick and white. Nothing aggravates the symptoms. She has tried antibiotics (on augmentin for sinus infection) for the symptoms. The treatment provided no relief.     Problems:  Patient Active Problem List   Diagnosis Date Noted   Pelvic pressure in female 09/03/2021   Bilateral neck pain 10/04/2020    Allergies:  Allergies  Allergen Reactions   Avocado Itching   Banana Itching   Medications:  Current Outpatient Medications:    fluconazole (DIFLUCAN) 150 MG tablet, Take 1 tablet (150 mg total) by mouth every 3 (three) days as needed., Disp: 3 tablet, Rfl: 0   amoxicillin-clavulanate (AUGMENTIN) 875-125 MG tablet, Take 1  tablet by mouth 2 (two) times daily., Disp: 14 tablet, Rfl: 0   cyclobenzaprine (FLEXERIL) 5 MG tablet, Take 1 tablet (5 mg total) by mouth 3 (three) times daily as needed for muscle spasms., Disp: 30 tablet, Rfl: 1   hydrocortisone 2.5 % cream, Apply topically daily., Disp: 30 g, Rfl: 0   norelgestromin-ethinyl estradiol (XULANE) 150-35  MCG/24HR transdermal patch, Place 1 patch onto the skin once a week., Disp: 3 patch, Rfl: 12   triamcinolone cream (KENALOG) 0.1 %, Apply 1 Application topically 2 (two) times daily., Disp: 30 g, Rfl: 0   valACYclovir (VALTREX) 1000 MG tablet, Take 1 tablet (1,000 mg total) by mouth 3 (three) times daily for 7 days., Disp: 21 tablet, Rfl: 0  Observations/Objective: Patient is well-developed, well-nourished in no acute distress.  Resting comfortably at home.  Head is normocephalic, atraumatic.  No labored breathing.  Speech is clear and coherent with logical content.  Patient is alert and oriented at baseline.    Assessment and Plan: 1. Antibiotic-induced yeast infection - fluconazole (DIFLUCAN) 150 MG tablet; Take 1 tablet (150 mg total) by mouth every 3 (three) days as needed.  Dispense: 3 tablet; Refill: 0  - Yeast infections due to current antibiotics.  - Diflucan given    Follow Up Instructions: I discussed the assessment and treatment plan with the patient. The patient was provided an opportunity to ask questions and all were answered. The patient agreed with the plan and demonstrated an understanding of the instructions.  A copy of instructions were sent to the patient via MyChart unless otherwise noted below.    The patient was advised to call back or seek an in-person evaluation if the symptoms worsen or if the condition fails to improve as anticipated.  Time:  I spent 7 minutes with the patient via telehealth technology discussing the above problems/concerns.    Margaretann Loveless, PA-C

## 2022-02-14 ENCOUNTER — Ambulatory Visit (INDEPENDENT_AMBULATORY_CARE_PROVIDER_SITE_OTHER): Payer: Medicaid Other

## 2022-02-14 ENCOUNTER — Other Ambulatory Visit (HOSPITAL_COMMUNITY)
Admission: RE | Admit: 2022-02-14 | Discharge: 2022-02-14 | Disposition: A | Discharge status: Home / Self Care | Payer: Medicaid Other | Source: Ambulatory Visit | Attending: Family Medicine | Admitting: Family Medicine

## 2022-02-14 DIAGNOSIS — Z113 Encounter for screening for infections with a predominantly sexual mode of transmission: Secondary | ICD-10-CM | POA: Insufficient documentation

## 2022-02-14 LAB — POCT URINALYSIS DIP (CLINITEK)
Bilirubin, UA: NEGATIVE
Glucose, UA: NEGATIVE mg/dL
Ketones, POC UA: NEGATIVE mg/dL
Leukocytes, UA: NEGATIVE
Nitrite, UA: NEGATIVE
POC PROTEIN,UA: NEGATIVE
Spec Grav, UA: 1.02 (ref 1.010–1.025)
Urobilinogen, UA: 0.2 E.U./dL
pH, UA: 6 (ref 5.0–8.0)

## 2022-02-14 NOTE — Progress Notes (Signed)
Pt presents for vaginal discharge after sexual encounter w/new partner w/mild odor

## 2022-02-15 ENCOUNTER — Other Ambulatory Visit: Payer: Self-pay | Admitting: Family

## 2022-02-15 ENCOUNTER — Telehealth: Payer: Self-pay

## 2022-02-15 DIAGNOSIS — B9689 Other specified bacterial agents as the cause of diseases classified elsewhere: Secondary | ICD-10-CM | POA: Insufficient documentation

## 2022-02-15 DIAGNOSIS — B379 Candidiasis, unspecified: Secondary | ICD-10-CM

## 2022-02-15 LAB — CERVICOVAGINAL ANCILLARY ONLY
Bacterial Vaginitis (gardnerella): POSITIVE — AB
Candida Glabrata: NEGATIVE
Candida Vaginitis: NEGATIVE
Chlamydia: NEGATIVE
Comment: NEGATIVE
Comment: NEGATIVE
Comment: NEGATIVE
Comment: NEGATIVE
Comment: NEGATIVE
Comment: NORMAL
Neisseria Gonorrhea: NEGATIVE
Trichomonas: NEGATIVE

## 2022-02-15 MED ORDER — FLUCONAZOLE 150 MG PO TABS: 150.0000 mg | ORAL_TABLET | ORAL | 0 refills | Status: DC | PRN

## 2022-02-15 MED ORDER — METRONIDAZOLE 500 MG PO TABS: 500.0000 mg | ORAL_TABLET | Freq: Two times a day (BID) | ORAL | 0 refills | Status: AC

## 2022-02-15 NOTE — Telephone Encounter (Signed)
Copied from CRM [id number]. Topic: General - Inquiry >> Feb 15, 2022 12:52 PM Runell Gess P wrote: Reason for CRM: Pt called saying she needs a yeast infection prescription.  She is on an antibiotic and it always causes yeast.  She uses [facility]  [phone]  [phone]

## 2022-02-15 NOTE — Telephone Encounter (Signed)
Order complete. 

## 2022-02-20 ENCOUNTER — Ambulatory Visit
Admission: RE | Admit: 2022-02-20 | Discharge: 2022-02-20 | Disposition: A | Discharge status: Home / Self Care | Payer: Medicaid Other | Source: Ambulatory Visit | Attending: Internal Medicine | Admitting: Internal Medicine

## 2022-02-20 VITALS — BP 130/81 | HR 82 | Temp 98.1°F | Resp 18

## 2022-02-20 DIAGNOSIS — B354 Tinea corporis: Secondary | ICD-10-CM

## 2022-02-20 MED ORDER — CLOTRIMAZOLE 1 % EX CREA: TOPICAL_CREAM | CUTANEOUS | 0 refills | Status: DC

## 2022-02-20 NOTE — ED Provider Notes (Signed)
[name]    CSN: [id number] Arrival date & time: 02/20/22  1303      History   Chief Complaint Chief Complaint  Patient presents with   Rash    HPI Vanessa Gallagher is a 31 y.o. female.   Patient presents with rash to anterior upper thigh that has been present for multiple days.  Patient reports that it is itchy but not painful.  Denies any associated fevers.  Patient originally thought that it was impetigo given that her son was recently diagnosed with it.  She has been using mupirocin cream with no improvement.   Rash   History reviewed. No pertinent past medical history.  Patient Active Problem List   Diagnosis Date Noted   Bacterial vaginosis 02/15/2022   Pelvic pressure in female 09/03/2021   Bilateral neck pain 10/04/2020    Past Surgical History:  Procedure Laterality Date   INDUCED ABORTION      OB History     Gravida  4   Para  1   Term  1   Preterm      AB  3   Living  1      SAB  1   IAB  0   Ectopic      Multiple  0   Live Births  1            Home Medications    Prior to Admission medications   Medication Sig Start Date End Date Taking? Authorizing Provider  clotrimazole (LOTRIMIN) 1 % cream Apply to affected area 2 times daily 02/20/22  Yes Tyjuan Demetro, [employer], FNP  amoxicillin-clavulanate (AUGMENTIN) 875-125 MG tablet Take 1 tablet by mouth 2 (two) times daily. 01/08/22   Waldon Merl, PA-C  cyclobenzaprine (FLEXERIL) 5 MG tablet Take 1 tablet (5 mg total) by mouth 3 (three) times daily as needed for muscle spasms. 11/09/21   Rema Fendt, NP  fluconazole (DIFLUCAN) 150 MG tablet Take 1 tablet (150 mg total) by mouth every 3 (three) days as needed. 02/15/22   Rema Fendt, NP  hydrocortisone 2.5 % cream Apply topically daily. 10/18/20   Arvilla Market, MD  metroNIDAZOLE (FLAGYL) 500 MG tablet Take 1 tablet (500 mg total) by mouth 2 (two) times daily for 7 days. 02/15/22 02/22/22  Rema Fendt, NP   norelgestromin-ethinyl estradiol [facility]) 150-35 MCG/24HR transdermal patch Place 1 patch onto the skin once a week. 09/06/21   Raelyn Mora, CNM  triamcinolone cream (KENALOG) 0.1 % Apply 1 Application topically 2 (two) times daily. 01/04/22   Margaretann Loveless, PA-C  ferrous sulfate 325 (65 FE) MG tablet Take 1 tablet (325 mg total) by mouth 3 (three) times daily with meals. 02/07/18 08/24/19  Gunnar Bulla, CNM  loratadine (CLARITIN) 10 MG tablet Take 1 tablet (10 mg total) by mouth daily. 03/05/18 08/24/19  Shambley, Melody N, CNM  ranitidine (ZANTAC) 150 MG tablet Take 1 tablet (150 mg total) by mouth 2 (two) times daily. Patient not taking: Reported on 02/04/2018 11/25/17 08/24/19  Purcell Nails, CNM    Family History Family History  Problem Relation Age of Onset   COPD Father     Social History Social History   Tobacco Use   Smoking status: Every Day    Packs/day: 0.25    Years: 10.00    Total pack years: 2.50    Types: E-cigarettes, Cigarettes    Passive exposure: Current   Smokeless tobacco: Never  Vaping  Use   Vaping Use: Every day  Substance Use Topics   Alcohol use: Yes    Alcohol/week: 6.0 standard drinks of alcohol    Types: 6 Cans of beer per week    Comment: weekends   Drug use: No     Allergies   Avocado and Banana   Review of Systems Review of Systems Per HPI  Physical Exam Triage Vital Signs ED Triage Vitals  Enc Vitals Group     BP 02/20/22 1338 130/81     Pulse Rate 02/20/22 1338 82     Resp 02/20/22 1338 18     Temp 02/20/22 1338 98.1 F (36.7 C)     Temp Source 02/20/22 1338 Oral     SpO2 02/20/22 1338 99 %     Weight --      Height --      Head Circumference --      Peak Flow --      Pain Score 02/20/22 1339 2     Pain Loc --      Pain Edu? --      Excl. in GC? --    No data found.  Updated Vital Signs BP 130/81 (BP Location: Left Arm)   Pulse 82   Temp 98.1 F (36.7 C) (Oral)   Resp 18   SpO2 99%   Visual  Acuity Right Eye Distance:   Left Eye Distance:   Bilateral Distance:    Right Eye Near:   Left Eye Near:    Bilateral Near:     Physical Exam Constitutional:      General: She is not in acute distress.    Appearance: Normal appearance. She is not toxic-appearing or diaphoretic.  HENT:     Head: Normocephalic and atraumatic.  Eyes:     Extraocular Movements: Extraocular movements intact.     Conjunctiva/sclera: Conjunctivae normal.  Pulmonary:     Effort: Pulmonary effort is normal.  Skin:    Comments: Approximately 1.5 to 2 cm in diameter circular, erythematous, ringlike, scaly flat rash present to anterior upper thigh.  No drainage noted.  Neurological:     General: No focal deficit present.     Mental Status: She is alert and oriented to person, place, and time. Mental status is at baseline.  Psychiatric:        Mood and Affect: Mood normal.        Behavior: Behavior normal.        Thought Content: Thought content normal.        Judgment: Judgment normal.      UC Treatments / Results  Labs (all labs ordered are listed, but only abnormal results are displayed) Labs Reviewed - No data to display  EKG   Radiology No results found.  Procedures Procedures (including critical care time)  Medications Ordered in UC Medications - No data to display  Initial Impression / Assessment and Plan / UC Course  I have reviewed the triage vital signs and the nursing notes.  Pertinent labs & imaging results that were available during my care of the patient were reviewed by me and considered in my medical decision making (see chart for details).     Lesion is consistent with ringworm.  No signs of bacterial infection on exam.  No concern for contact dermatitis.  Will treat with clotrimazole.  Advised patient that it may take multiple weeks for it to resolve.  Although, patient advised to follow-up if symptoms persist or worsen.  Patient verbalized understanding and was  agreeable with plan. Final Clinical Impressions(s) / UC Diagnoses   Final diagnoses:  Tinea corporis     Discharge Instructions      You have ringworm which is a fungal infection of the skin.  It may take 2 to 3 weeks for it to resolve.  Also recommend that you use the cream 2 days after rash has completely resolved.  Follow-up if symptoms persist or worsen.    ED Prescriptions     Medication Sig Dispense Auth. Provider   clotrimazole (LOTRIMIN) 1 % cream Apply to affected area 2 times daily 15 g Gustavus Bryant, [place]      PDMP not reviewed this encounter.   Gustavus Bryant, [place] 02/20/22 1415

## 2022-02-20 NOTE — Discharge Instructions (Signed)
You have ringworm which is a fungal infection of the skin.  It may take 2 to 3 weeks for it to resolve.  Also recommend that you use the cream 2 days after rash has completely resolved.  Follow-up if symptoms persist or worsen.

## 2022-02-20 NOTE — ED Triage Notes (Signed)
Pt believes she may have a spot of impetigo from her son.

## 2022-03-08 ENCOUNTER — Encounter: Payer: Self-pay | Admitting: Family Medicine

## 2022-03-11 ENCOUNTER — Other Ambulatory Visit: Payer: Self-pay | Admitting: Family Medicine

## 2022-03-11 DIAGNOSIS — Z308 Encounter for other contraceptive management: Secondary | ICD-10-CM

## 2022-03-11 NOTE — Telephone Encounter (Signed)
Medication Refill - Medication: norelgestromin-ethinyl estradiol [facility]) 150-35 MCG/24HR transdermal patch  Has the patient contacted their pharmacy? No.   Preferred Pharmacy (with phone number or street name):  [facility] [address] [place]), [place] - [id number] Phone:  [phone]  Fax:  [phone]     Has the patient been seen for an appointment in the last year OR does the patient have an upcoming appointment? Yes.    Patient states she left patches in the car and now they will not stay stuck on skin. Needing rx to be filled earlier than normal.

## 2022-03-12 ENCOUNTER — Telehealth: Payer: Self-pay | Admitting: Family Medicine

## 2022-03-12 ENCOUNTER — Encounter: Payer: Self-pay | Admitting: Family Medicine

## 2022-03-12 ENCOUNTER — Other Ambulatory Visit: Payer: Self-pay | Admitting: Family Medicine

## 2022-03-12 MED ORDER — NORELGESTROMIN-ETH ESTRADIOL 150-35 MCG/24HR TD PTWK: 1.0000 | MEDICATED_PATCH | TRANSDERMAL | 12 refills | Status: DC

## 2022-03-12 NOTE — Telephone Encounter (Signed)
Please read previous note- pharmacy sent to PCP office instead of prescriber. Routing to provider.  Requested Prescriptions  Pending Prescriptions Disp Refills   norelgestromin-ethinyl estradiol [facility]) 150-35 MCG/24HR transdermal patch 3 patch 12    Sig: Place 1 patch onto the skin once a week.     OB/GYN:  Contraceptives Failed - 03/11/2022 11:56 AM      Failed - Patient is not a smoker      Passed - Last BP in normal range    BP Readings from Last 1 Encounters:  02/20/22 130/81         Passed - Valid encounter within last 12 months    Recent Outpatient Visits           4 months ago Chronic neck pain   [facility] at [facility], Amy J, NP   6 months ago Pelvic pressure in female   [facility] Care at [facility], Cari S, PA-C   7 months ago Routine screening for STI (sexually transmitted infection)   Primary Care at [facility], [place], NP   1 year ago Neck pain   Primary Care at [facility], Kandee Keen, MD   1 year ago Encounter for annual physical exam   [facility] Care at [facility], Kandee Keen, MD

## 2022-03-12 NOTE — Telephone Encounter (Signed)
Pt reports that she will going to the beach over the weekend and really needs to get the Rx refilled today. 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 Pt requests Rx refill request be approved today.

## 2022-03-12 NOTE — Telephone Encounter (Signed)
Pt asking about Urgent Patch Refill (see Pt message for details)  norelgestromin-ethinyl estradiol [facility]) 150-35 MCG/24HR transdermal patch [[id number]]   Pharmacy  [facility] [address] ([address] [place] - [address]  [address] [place] ([place]) [place] [address]  Phone:  [phone]  Fax:  [phone]

## 2022-03-13 NOTE — Telephone Encounter (Signed)
Has been refilled

## 2022-03-23 ENCOUNTER — Telehealth: Payer: Medicaid Other | Admitting: Family Medicine

## 2022-03-23 DIAGNOSIS — L0100 Impetigo, unspecified: Secondary | ICD-10-CM

## 2022-03-23 MED ORDER — CEPHALEXIN 500 MG PO CAPS: 500.0000 mg | ORAL_CAPSULE | Freq: Three times a day (TID) | ORAL | 0 refills | Status: AC

## 2022-03-23 NOTE — Progress Notes (Signed)
Virtual Visit Consent   Vanessa Gallagher, you are scheduled for a virtual visit with a [facility] provider today. Just as with appointments in the office, your consent must be obtained to participate. Your consent will be active for this visit and any virtual visit you may have with one of our providers in the next 365 days. If you have a MyChart account, a copy of this consent can be sent to you electronically.  As this is a virtual visit, video technology does not allow for your provider to perform a traditional examination. This may limit your provider's ability to fully assess your condition. If your provider identifies any concerns that need to be evaluated in person or the need to arrange testing (such as labs, EKG, etc.), we will make arrangements to do so. Although advances in technology are sophisticated, we cannot ensure that it will always work on either your end or our end. If the connection with a video visit is poor, the visit may have to be switched to a telephone visit. With either a video or telephone visit, we are not always able to ensure that we have a secure connection.  By engaging in this virtual visit, you consent to the provision of healthcare and authorize for your insurance to be billed (if applicable) for the services provided during this visit. Depending on your insurance coverage, you may receive a charge related to this service.  I need to obtain your verbal consent now. Are you willing to proceed with your visit today? Vanessa Gallagher has provided verbal consent on 03/23/2022 for a virtual visit (video or telephone). Georgana Curio, FNP  Date: 03/23/2022 12:27 PM  Virtual Visit via Video Note   I, Georgana Curio, connected with  Vanessa Gallagher  ([id number], January 25, 1991) on 03/23/22 at 12:30 PM EDT by a video-enabled telemedicine application and verified that I am speaking with the correct person using two identifiers.  Location: Patient: Virtual Visit Location Patient:  Home Provider: Virtual Visit Location Provider: Home Office   I discussed the limitations of evaluation and management by telemedicine and the availability of in person appointments. The patient expressed understanding and agreed to proceed.    History of Present Illness: Vanessa Gallagher is a 31 y.o. who identifies as a female who was assigned female at birth, and is being seen today for honey crusted lesions on head. Her son had impetigo. Mupirocin has not cleared it up.   HPI: HPI  Problems:  Patient Active Problem List   Diagnosis Date Noted   Bacterial vaginosis 02/15/2022   Pelvic pressure in female 09/03/2021   Bilateral neck pain 10/04/2020    Allergies:  Allergies  Allergen Reactions   Avocado Itching   Banana Itching   Medications:  Current Outpatient Medications:    amoxicillin-clavulanate (AUGMENTIN) 875-125 MG tablet, Take 1 tablet by mouth 2 (two) times daily., Disp: 14 tablet, Rfl: 0   clotrimazole (LOTRIMIN) 1 % cream, Apply to affected area 2 times daily, Disp: 15 g, Rfl: 0   cyclobenzaprine (FLEXERIL) 5 MG tablet, Take 1 tablet (5 mg total) by mouth 3 (three) times daily as needed for muscle spasms., Disp: 30 tablet, Rfl: 1   fluconazole (DIFLUCAN) 150 MG tablet, Take 1 tablet (150 mg total) by mouth every 3 (three) days as needed., Disp: 3 tablet, Rfl: 0   hydrocortisone 2.5 % cream, Apply topically daily., Disp: 30 g, Rfl: 0   norelgestromin-ethinyl estradiol [facility]ico) 150-35 MCG/24HR transdermal patch, Place 1 patch  onto the skin once a week., Disp: 3 patch, Rfl: 12   triamcinolone cream (KENALOG) 0.1 %, Apply 1 Application topically 2 (two) times daily., Disp: 30 g, Rfl: 0  Observations/Objective: Patient is well-developed, well-nourished in no acute distress.  Resting comfortably  at home.  Head is normocephalic, atraumatic.  No labored breathing.  Speech is clear and coherent with logical content.  Patient is alert and oriented at baseline.    Assessment  and Plan: 1. Impetigo  Add keflex, continue mupirocin, follow up as needed if sx persist or worsen at urgent care.   Follow Up Instructions: I discussed the assessment and treatment plan with the patient. The patient was provided an opportunity to ask questions and all were answered. The patient agreed with the plan and demonstrated an understanding of the instructions.  A copy of instructions were sent to the patient via MyChart unless otherwise noted below.     The patient was advised to call back or seek an in-person evaluation if the symptoms worsen or if the condition fails to improve as anticipated.  Time:  I spent 10 minutes with the patient via telehealth technology discussing the above problems/concerns.    Georgana Curio, FNP

## 2022-03-23 NOTE — Patient Instructions (Signed)
Impetigo, Adult Impetigo is an infection of the skin. It commonly occurs in young children, but it can also occur in adults. The infection causes itchy blisters and sores that produce brownish-yellow fluid. As the fluid dries, it forms a thick, honey-colored crust. These skin changes usually occur on the face, but they can also affect other areas of the body. Impetigo usually goes away in 7-10 days with treatment. What are the causes? This condition is caused by two types of bacteria. It may be caused by staphylococci or streptococci bacteria. These bacteria cause impetigo when they get under the surface of the skin. This often happens after some damage to the skin, such as: Cuts, scrapes, or scratches. Rashes. Insect bites, especially when you scratch the area of a bite. Chickenpox or other illnesses that cause open skin sores. Nail biting or chewing. Impetigo can spread easily from one person to another (is contagious). It may be spread through close skin contact or by sharing towels, clothing, or other items that an infected person has touched. Scratching the affected area can cause impetigo to spread to other parts of the body. The bacteria can get under your fingernails and spread when you touch another area of your skin. What increases the risk? The following factors may make you more likely to develop this condition: Playing sports that include skin-to-skin contact with others. Having broken skin, such as from a cut or scrape. Living in an area that has high humidity levels. Having poor hygiene. Having high levels of staphylococci in your nose. Having a condition that weakens the skin integrity, such as: Having a weak body defense system (immune system). Having a skin condition with open sores, such as chickenpox. Having diabetes. What are the signs or symptoms? The main symptom of this condition is small blisters, often on the face around the mouth and nose. In time, the blisters break  open and turn into tiny sores (lesions) with a yellow crust. In some cases, the blisters cause itching or burning. Scratching, irritation, or lack of treatment may cause these small lesions to get larger. Other possible symptoms include: Larger blisters. Pus. Swollen lymph glands. How is this diagnosed? This condition is usually diagnosed during a physical exam. A skin sample or a sample of fluid from a blister may be taken for lab tests that involve growing bacteria (culture test). Lab tests can help confirm the diagnosis or help determine the best treatment. How is this treated? Treatment for this condition depends on the severity of the condition: Mild impetigo can be treated with prescription antibiotic cream. Oral antibiotic medicine may be used in more severe cases. Medicines that reduce itchiness (antihistamines)may also be used. Follow these instructions at home: Medicines Take over-the-counter and prescription medicines only as told by your health care provider. Apply or take your antibiotic as told by your health care provider. Do not stop using the antibiotic even if your condition improves. Before applying antibiotic cream or ointment, you should: Gently wash the infected areas with antibacterial soap and warm water. Soak crusted areas in warm, soapy water using antibacterial soap. Gently rub the areas to remove crusts. Do not scrub. Preventing the spread of infection  To help prevent impetigo from spreading to other body areas: Keep your fingernails short and clean. Do not scratch the blisters or sores. Cover infected areas, if necessary, to keep from scratching. Wash your hands often with soap and warm water. To help prevent impetigo from spreading to other people: Do not share towels.   Wash your clothing and bedsheets in water that is 140F (60C) or warmer. Stay home until you have used an antibiotic cream for 48 hours (2 days) or an oral antibiotic medicine for 24 hours  (1 day). You should only return to work and activities with other people if your skin shows significant improvement. You may return to contact sports after you have used antibiotic medicine for 72 hours (3 days). General instructions Keep all follow-up visits. This is important. How is this prevented? Wash your hands often with soap and warm water. Do not share towels, washcloths, clothing, bedding, or razors. Keep your fingernails short. Keep any cuts, scrapes, bug bites, or rashes clean and covered. Use insect repellent to prevent bug bites. Contact a health care provider if: You develop more blisters or sores, even with treatment. Other family members get sores. Your skin sores are not improving after 72 hours (3 days) of treatment. You have a fever. Get help right away if: You see spreading redness or swelling of the skin around your sores. You develop a sore throat. The area around your rash becomes warm, red, or tender to the touch. You have dark, reddish-brown urine. You do not urinate often or you urinate small amounts. You are very tired (lethargic). You have swelling in your face, hands, or feet. Summary Impetigo is a skin infection that causes itchy blisters and sores that produce brownish-yellow fluid. As the fluid dries, it forms a crust. This condition is caused by staphylococci or streptococci bacteria. These bacteria cause impetigo when they get under the surface of the skin, such as through cuts, rashes, bug bites, or open sores. Treatment for this condition may include antibiotic ointment or oral antibiotics. To help prevent impetigo from spreading to other body areas, make sure you keep your fingernails short, avoid scratching, cover any blisters, and wash your hands often. If you have impetigo, stay home until you have used an antibiotic cream for 48 hours (2 days) or an oral antibiotic medicine for 24 hours (1 day). You should only return to work and activities with  other people if your skin shows significant improvement. This information is not intended to replace advice given to you by your health care provider. Make sure you discuss any questions you have with your health care provider. Document Revised: 12/01/2019 Document Reviewed: 12/01/2019 Elsevier Patient Education  2023 Elsevier Inc.  

## 2022-04-04 ENCOUNTER — Other Ambulatory Visit: Payer: Self-pay | Admitting: Family Medicine

## 2022-04-04 ENCOUNTER — Telehealth: Payer: Self-pay | Admitting: Family Medicine

## 2022-04-04 DIAGNOSIS — Z308 Encounter for other contraceptive management: Secondary | ICD-10-CM

## 2022-04-04 MED ORDER — NORELGESTROMIN-ETH ESTRADIOL 150-35 MCG/24HR TD PTWK: 1.0000 | MEDICATED_PATCH | TRANSDERMAL | 15 refills | Status: DC

## 2022-04-04 NOTE — Telephone Encounter (Signed)
Copied from [place] [id number]. Topic: General - Other >> Apr 04, 2022 12:48 PM Ja-Kwan M wrote: Reason for CRM: Merrilee Seashore with [facility] request prior authorization for the Rx for norelgestromin-ethinyl estradiol [name] transdermal patch. Merrilee Seashore asked that pt be contacted. Cb# [id number]

## 2022-04-04 NOTE — Telephone Encounter (Signed)
Patient called and advised a Rx was sent on 03/12/22 to [facility]. She says when she spoke to them they didn't have anything and told her to call her insurance. She called the insurance and was told there is not a new one in process that the old one is not valid anymore, so a new one will need to be sent in. She asked if we could resend to [facility] on [address] as it's easier for her to pick up there. Advised to call them in about an hour to verify if they have it and is in process. She says she will call the office back if any issues.

## 2022-04-04 NOTE — Telephone Encounter (Signed)
Requested Prescriptions  Pending Prescriptions Disp Refills  . norelgestromin-ethinyl estradiol [name] transdermal patch 3 patch 15    Sig: Place 1 patch onto the skin once a week.     OB/GYN:  Contraceptives Failed - 04/04/2022 11:29 AM      Failed - Patient is not a smoker      Passed - Last BP in normal range    BP Readings from Last 1 Encounters:  02/20/22 130/81         Passed - Valid encounter within last 12 months    Recent Outpatient Visits          4 months ago Chronic neck pain   [facility] at [facility], Amy J, NP   7 months ago Pelvic pressure in female   Primary Care at [facility], Cari S, PA-C   8 months ago Routine screening for STI (sexually transmitted infection)   Primary Care at [facility], [place], NP   1 year ago Neck pain   Primary Care at [facility], Bayard Beaver, MD   1 year ago Encounter for annual physical exam   Primary Care at [facility], Bayard Beaver, MD

## 2022-04-04 NOTE — Telephone Encounter (Signed)
Medication Refill - Medication: norelgestromin-ethinyl estradiol [name] transdermal patch  Has the patient contacted their pharmacy? No.   Preferred Pharmacy (with phone number or street name): [facility], [address] Phone:  [phone]  Fax:  [phone]      Has the patient been seen for an appointment in the last year OR does the patient have an upcoming appointment? Yes.    Agent: Please be advised that RX refills may take up to 3 business days. We ask that you follow-up with your pharmacy.

## 2022-04-05 NOTE — Telephone Encounter (Signed)
I have attempted to contact this patient by phone with the following results: no answer, message left to return my call with any question regarding PA for birth control.

## 2022-04-09 NOTE — Telephone Encounter (Signed)
Patient returned phone call, about birth control med, xulane havilng it sent to [facility] because its cheaper, after Josem Kaufmann is discussed. Please call back

## 2022-04-10 NOTE — Telephone Encounter (Signed)
I have attempted without success to contact this patient by phone to I left a message on answering machine and call back number. We need to discuss PA

## 2022-04-12 ENCOUNTER — Other Ambulatory Visit: Payer: Self-pay | Admitting: *Deleted

## 2022-04-12 DIAGNOSIS — Z308 Encounter for other contraceptive management: Secondary | ICD-10-CM

## 2022-04-12 MED ORDER — NORELGESTROMIN-ETH ESTRADIOL 150-35 MCG/24HR TD PTWK: 1.0000 | MEDICATED_PATCH | TRANSDERMAL | 15 refills | Status: DC

## 2022-04-12 NOTE — Telephone Encounter (Signed)
Patient returned call back about having med, Xulane sent to [facility] pharmacy after Josem Kaufmann. Please call back

## 2022-04-12 NOTE — Telephone Encounter (Signed)
Called patient and spoke with her. Patient will use [employer] card to help get birthcontrol. RX resent to [facility]

## 2022-04-12 NOTE — Telephone Encounter (Signed)
Pt returned call, please advise  

## 2022-06-12 IMAGING — US US BREAST*R* LIMITED INC AXILLA
1 series · 11 of 11 positions shown · non-contrast
Comparison: Previous exam(s).

CLINICAL DATA: Short-term follow-up for 2 likely benign right
breast masses.

EXAM:
ULTRASOUND OF THE RIGHT BREAST

[Series 1: us breast*right* limited inc axilla · 0.05mm/px · 11 of 11 slices shown]
[im 1/11]
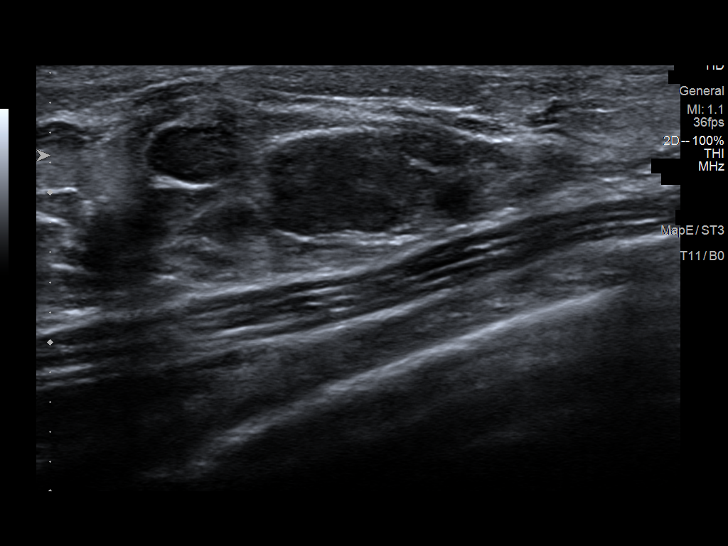
[im 2/11]
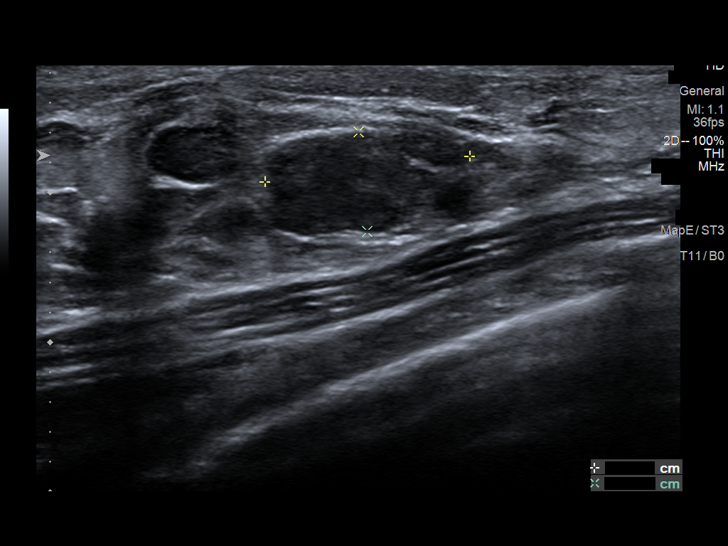
[im 3/11]
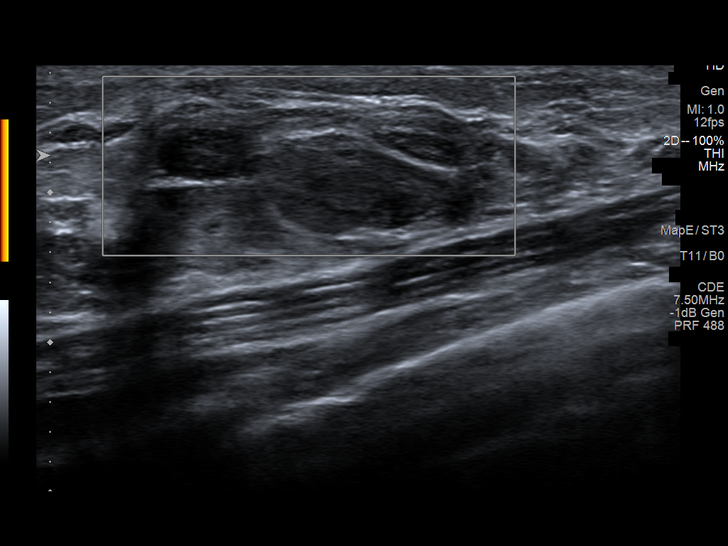
[im 4/11]
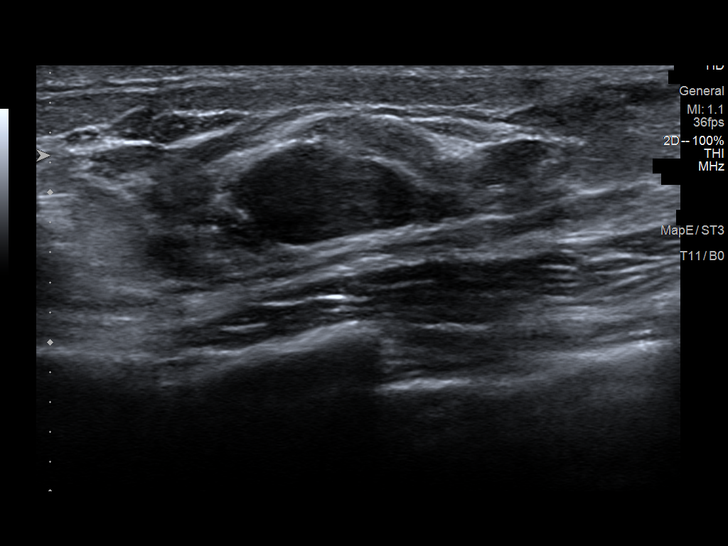
[im 5/11]
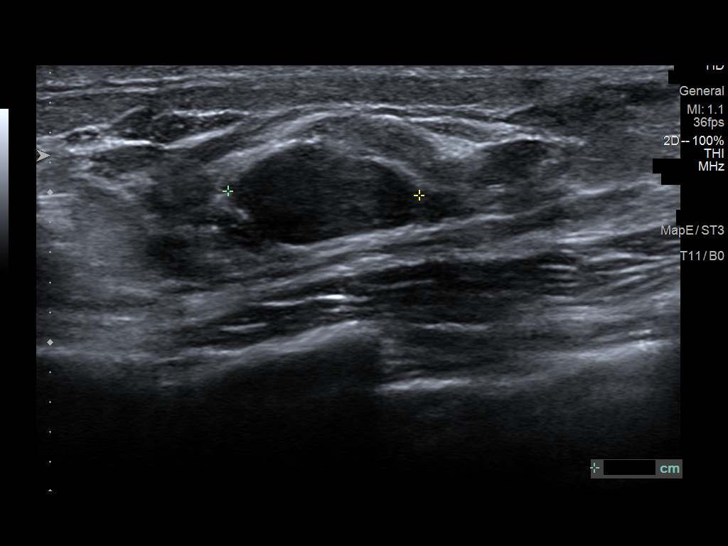
[im 6/11]
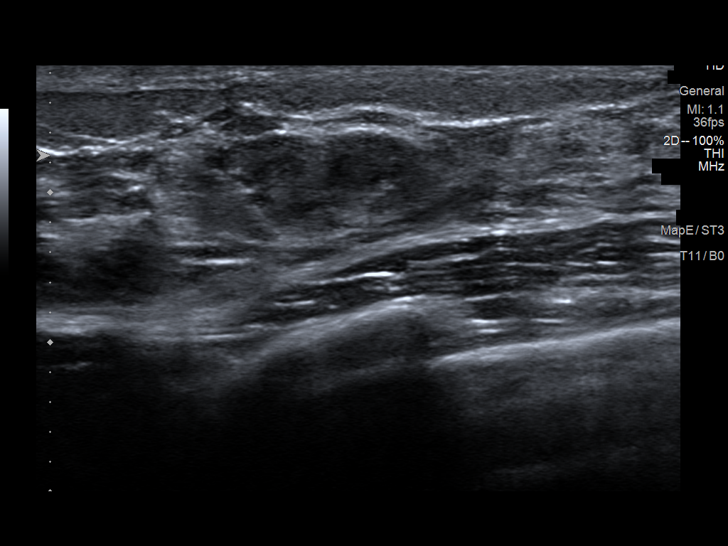
[im 7/11]
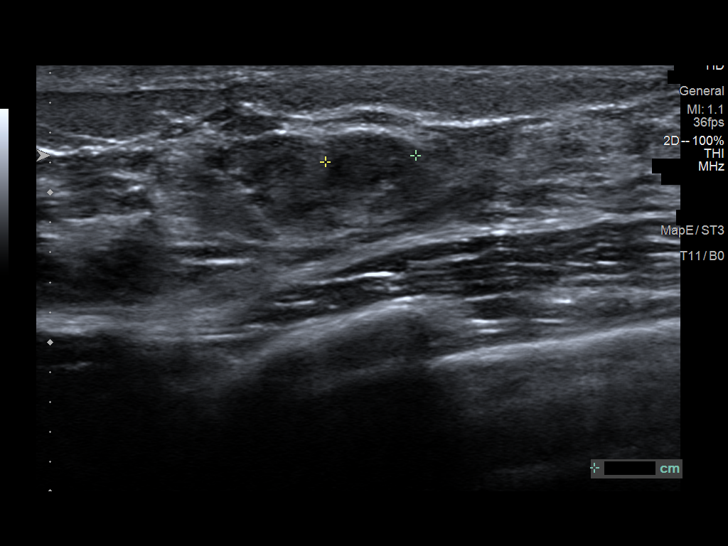
[im 8/11]
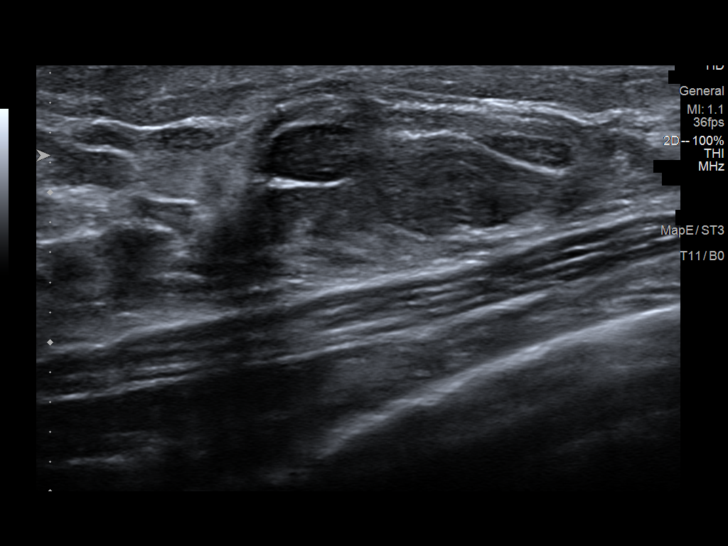
[im 9/11]
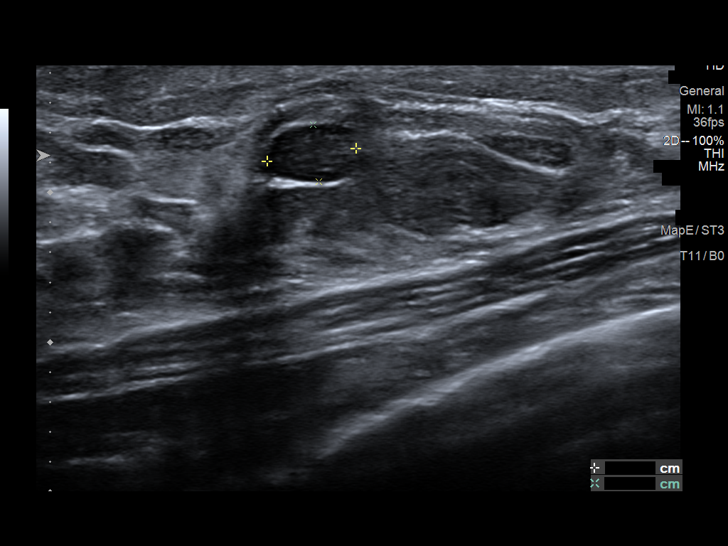
[im 10/11]
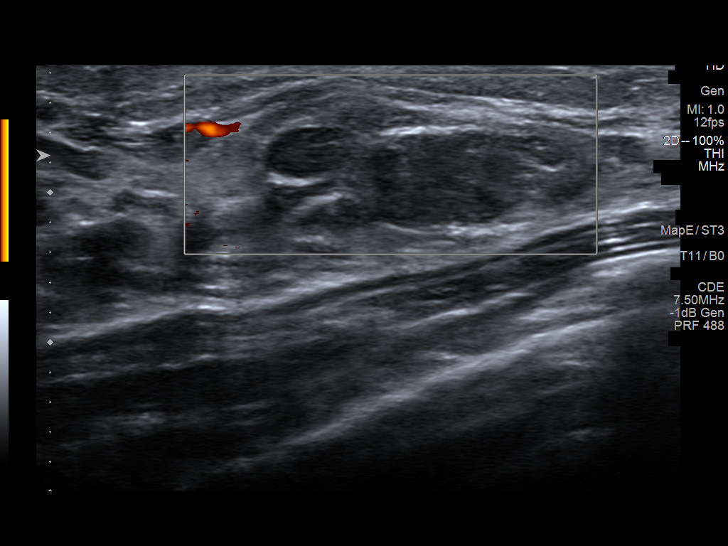
[im 11/11]
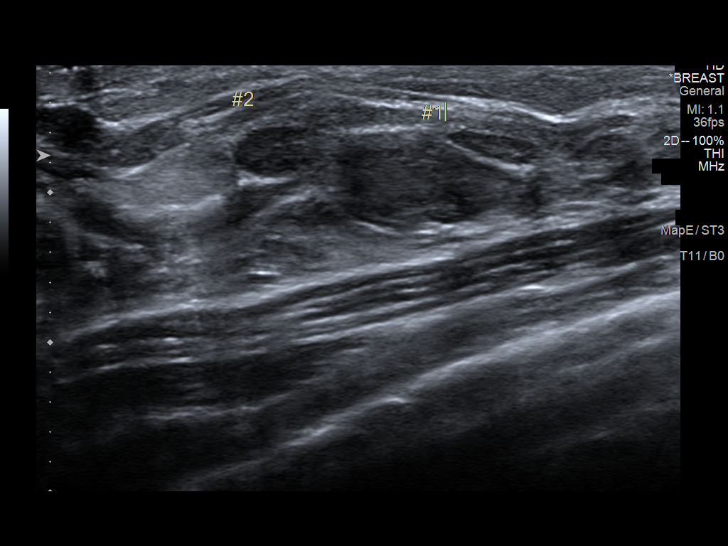

[11 of 11 positions shown; findings below may reference images not displayed]

FINDINGS: Ultrasound targeted to the right breast at 4 o'clock, 3 cm from the
nipple demonstrates a stable oval hypoechoic mass measuring 1.4 x
0.7 x 1.3 cm, previously measuring 1.5 x 0.7 x 1.5 cm.

Ultrasound of the adjacent mass in the right breast at 4 o'clock, 3
cm from the nipple demonstrates a stable oval hypoechoic mass
measuring 0.6 x 0.4 x 0.6 cm, previously 0.7 x 0.4 x 0.6 cm.
IMPRESSION: The 2 likely benign right breast masses at 4 o'clock is stable.

RECOMMENDATION:
Six-month follow-up right breast ultrasound.

I have discussed the findings and recommendations with the patient.
If applicable, a reminder letter will be sent to the patient
regarding the next appointment.

BI-RADS CATEGORY  3: Probably benign.

## 2022-06-22 ENCOUNTER — Ambulatory Visit
Admission: EM | Admit: 2022-06-22 | Discharge: 2022-06-22 | Disposition: A | Discharge status: Home / Self Care | Payer: Medicaid Other | Attending: Urgent Care | Admitting: Urgent Care

## 2022-06-22 DIAGNOSIS — M542 Cervicalgia: Secondary | ICD-10-CM

## 2022-06-22 DIAGNOSIS — G8929 Other chronic pain: Secondary | ICD-10-CM

## 2022-06-22 DIAGNOSIS — B9789 Other viral agents as the cause of diseases classified elsewhere: Secondary | ICD-10-CM

## 2022-06-22 MED ORDER — PREDNISONE 20 MG PO TABS: 60.0000 mg | ORAL_TABLET | Freq: Every day | ORAL | 0 refills | Status: AC

## 2022-06-22 MED ORDER — CYCLOBENZAPRINE HCL 10 MG PO TABS: 10.0000 mg | ORAL_TABLET | Freq: Every day | ORAL | 0 refills | Status: DC

## 2022-06-22 NOTE — ED Provider Notes (Signed)
Renaldo Fiddler    CSN: [id number] Arrival date & time: 06/22/22  1203      History   Chief Complaint Chief Complaint  Patient presents with   Neck Injury    Entered by patient    HPI Vanessa Gallagher is a 31 y.o. female.    Neck Injury    Patient presents to urgent care with complaint of posterior neck pain and sinus pain and pressure around and "behind" her nose.  Symptoms acutely worse in the last 2 days.  Patient has 2-year history of neck pain and has been to orthopedics to have it evaluated.  Radiographic imaging was unremarkable.  The provider referred her to physical therapy however she was unable to schedule.  Told that she had to wait for them to call her and then they never called.  She has tried Flexeril in the past but rarely uses it because of its sedation associated with it.  She has a young child who she needs to be aware of during the night.  She also drives for [employer].  The provider asked her to use ibuprofen 800 mg twice a day and she states she has been doing this consistently but "it does not touch the pain.  She also states she has been using a rice pack with applying heat which helps to soothe the pain.  She endorses pain with movement.  History reviewed. No pertinent past medical history.  Patient Active Problem List   Diagnosis Date Noted   Bacterial vaginosis 02/15/2022   Pelvic pressure in female 09/03/2021   Bilateral neck pain 10/04/2020    Past Surgical History:  Procedure Laterality Date   INDUCED ABORTION      OB History     Gravida  4   Para  1   Term  1   Preterm      AB  3   Living  1      SAB  1   IAB  0   Ectopic      Multiple  0   Live Births  1            Home Medications    Prior to Admission medications   Medication Sig Start Date End Date Taking? Authorizing Provider  amoxicillin-clavulanate (AUGMENTIN) 875-125 MG tablet Take 1 tablet by mouth 2 (two) times daily. 01/08/22   Waldon Merl, PA-C  clotrimazole (LOTRIMIN) 1 % cream Apply to affected area 2 times daily 02/20/22   Gustavus Bryant, FNP  cyclobenzaprine (FLEXERIL) 5 MG tablet Take 1 tablet (5 mg total) by mouth 3 (three) times daily as needed for muscle spasms. 11/09/21   Rema Fendt, NP  fluconazole (DIFLUCAN) 150 MG tablet Take 1 tablet (150 mg total) by mouth every 3 (three) days as needed. 02/15/22   Rema Fendt, NP  hydrocortisone 2.5 % cream Apply topically daily. 10/18/20   Arvilla Market, MD  norelgestromin-ethinyl estradiol [facility]ico) 150-35 MCG/24HR transdermal patch Place 1 patch onto the skin once a week. 04/12/22   Georganna Skeans, MD  triamcinolone cream (KENALOG) 0.1 % Apply 1 Application topically 2 (two) times daily. 01/04/22   Margaretann Loveless, PA-C  ferrous sulfate 325 (65 FE) MG tablet Take 1 tablet (325 mg total) by mouth 3 (three) times daily with meals. 02/07/18 08/24/19  Gunnar Bulla, CNM  loratadine (CLARITIN) 10 MG tablet Take 1 tablet (10 mg total) by mouth daily. 03/05/18 08/24/19  Purcell Nails,  CNM  ranitidine (ZANTAC) 150 MG tablet Take 1 tablet (150 mg total) by mouth 2 (two) times daily. Patient not taking: Reported on 02/04/2018 11/25/17 08/24/19  Purcell NailsShambley, Melody N, CNM    Family History Family History  Problem Relation Age of Onset   COPD Father     Social History Social History   Tobacco Use   Smoking status: Every Day    Packs/day: 0.25    Years: 10.00    Total pack years: 2.50    Types: E-cigarettes, Cigarettes    Passive exposure: Current   Smokeless tobacco: Never  Vaping Use   Vaping Use: Every day  Substance Use Topics   Alcohol use: Yes    Alcohol/week: 6.0 standard drinks of alcohol    Types: 6 Cans of beer per week    Comment: weekends   Drug use: No     Allergies   Avocado and Banana   Review of Systems Review of Systems   Physical Exam Triage Vital Signs ED Triage Vitals  Enc Vitals Group     BP 06/22/22 1218 110/63      Pulse Rate 06/22/22 1218 78     Resp 06/22/22 1218 16     Temp 06/22/22 1218 98.3 F (36.8 C)     Temp Source 06/22/22 1218 Oral     SpO2 06/22/22 1218 99 %     Weight --      Height --      Head Circumference --      Peak Flow --      Pain Score 06/22/22 1223 8     Pain Loc --      Pain Edu? --      Excl. in GC? --    No data found.  Updated Vital Signs BP 110/63 (BP Location: Left Arm)   Pulse 78   Temp 98.3 F (36.8 C) (Oral)   Resp 16   LMP 06/12/2022 (Approximate)   SpO2 99%   Visual Acuity Right Eye Distance:   Left Eye Distance:   Bilateral Distance:    Right Eye Near:   Left Eye Near:    Bilateral Near:     Physical Exam Vitals reviewed.  Constitutional:      Appearance: Normal appearance.  Musculoskeletal:     Cervical back: No rigidity. Pain with movement present. No spinous process tenderness or muscular tenderness.  Skin:    General: Skin is warm and dry.  Neurological:     General: No focal deficit present.     Mental Status: She is alert and oriented to person, place, and time.  Psychiatric:        Mood and Affect: Mood normal.        Behavior: Behavior normal.      UC Treatments / Results  Labs (all labs ordered are listed, but only abnormal results are displayed) Labs Reviewed - No data to display  EKG   Radiology No results found.  Procedures Procedures (including critical care time)  Medications Ordered in UC Medications - No data to display  Initial Impression / Assessment and Plan / UC Course  I have reviewed the triage vital signs and the nursing notes.  Pertinent labs & imaging results that were available during my care of the patient were reviewed by me and considered in my medical decision making (see chart for details).   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  Will represcribe Flexeril for the few days she is able to  use it at night for sleep.  Also recommended thermic care heating patches to be used while at  work and while at sleep.  Suspect viral sinusitis as the cause of her anterior facial pain.  Given the severity of her reported symptoms, we discussed a course of prednisone to reduce inflammation in her sinus cavities.  She endorses history of anxiety and hyperactivity at baseline and is somewhat concerned about whether she will tolerate prednisone however she states she is willing to try it.   Final Clinical Impressions(s) / UC Diagnoses   Final diagnoses:  None   Discharge Instructions   None    ED Prescriptions   None    PDMP not reviewed this encounter.   Rose Phi, [place] 06/22/22 1253

## 2022-06-22 NOTE — Discharge Instructions (Addendum)
Please schedule a follow-up visit with Orthopedics to re-evaluate your chronic neck pain.

## 2022-06-22 NOTE — ED Triage Notes (Signed)
Pt. Presents to UC w/ c/o posterior neck pain and states this is a chronic problem for the past two days

## 2022-07-04 ENCOUNTER — Ambulatory Visit: Payer: Self-pay | Admitting: *Deleted

## 2022-07-04 NOTE — Telephone Encounter (Signed)
Reason for Disposition  [1] Chest pain (or "angina") comes and goes AND [2] is happening more often (increasing in frequency) or getting worse (increasing in severity)  (Exception: Chest pains that last only a few seconds.)  Answer Assessment - Initial Assessment Questions 1. LOCATION: "Where does it hurt?"       Midline- whole chest- very much like indigestion 2. RADIATION: "Does the pain go anywhere else?" (e.g., into neck, jaw, arms, back)     No radiation 3. ONSET: "When did the chest pain begin?" (Minutes, hours or days)      2 days 4. PATTERN: "Does the pain come and go, or has it been constant since it started?"  "Does it get worse with exertion?"      Constant- does get worse at times- pressure type pain 5. DURATION: "How long does it last" (e.g., seconds, minutes, hours)     Constant- but intense pressure- lasting minutes 6. SEVERITY: "How bad is the pain?"  (e.g., Scale 1-10; mild, moderate, or severe)    - MILD (1-3): doesn't interfere with normal activities     - MODERATE (4-7): interferes with normal activities or awakens from sleep    - SEVERE (8-10): excruciating pain, unable to do any normal activities       Pressure-mild  9. CAUSE: "What do you think is causing the chest pain?"     Possible COVID symptom 10. OTHER SYMPTOMS: "Do you have any other symptoms?" (e.g., dizziness, nausea, vomiting, sweating, fever, difficulty breathing, cough)       no  Protocols used: Chest Pain-A-AH

## 2022-07-04 NOTE — Telephone Encounter (Signed)
  Chief Complaint: chest pain Symptoms: midline chest pain- constant- gets worse at times Frequency: 2 days Pertinent Negatives: Patient denies dizziness, nausea, vomiting, sweating, fever, difficulty breathing, cough Disposition: [x] ED /[] Urgent Care (no appt availability in office) / [] Appointment(In office/virtual)/ []  [facility]/ [] Home Care/ [] Refused Recommended Disposition /[] [facility]/ []  Follow-up with PCP Additional Notes: Unexplained chest pain- ED advised. Not helped by antiacid- recent COVID exposure

## 2022-07-05 ENCOUNTER — Telehealth: Payer: Medicaid Other | Admitting: Emergency Medicine

## 2022-07-05 DIAGNOSIS — U071 COVID-19: Secondary | ICD-10-CM

## 2022-07-05 NOTE — Progress Notes (Signed)
Based on you feel like you can't get enough air into your lungs and because you are short of breath, I feel your condition warrants further evaluation as soon as possible at an Emergency department.    NOTE: There will be NO CHARGE for this eVisit   If you are having a true medical emergency please call 911.      Emergency Department-[facility]  Get Driving Directions  [phone]  [address]  [place], [place] [address]  Open 24/7/365      [facility] Emergency Department at [facility]  Get Driving Directions  [address]  [place], [place] [address]  Open 24/7/365    Emergency Department- [facility]  Get Driving Directions  [phone]  [address]  [place], [place] [address]  Open 24/7/365      [facility] at [facility]  Get Driving Directions  [phone]  [address]  [place], [place] [address]  Open 24/7/365    [facility]  Emergency Department- [facility]  Get Driving Directions  [phone]  [address]  [place], [place] [address]  Open 24/7/365    [place]  Emergency Department- [facility]  Get Driving Directions  [address]  [place], [place] [address]  Open 24/7/365    [facility]  Emergency Department- [facility]  Get Driving Directions  [phone]  [address]  [place], [place] [address]  Open 24/7/365

## 2022-07-11 ENCOUNTER — Other Ambulatory Visit: Payer: Self-pay

## 2022-07-11 ENCOUNTER — Ambulatory Visit
Admission: RE | Admit: 2022-07-11 | Discharge: 2022-07-11 | Disposition: A | Discharge status: Home / Self Care | Payer: Medicaid Other | Source: Ambulatory Visit | Attending: Family Medicine | Admitting: Family Medicine

## 2022-07-11 ENCOUNTER — Ambulatory Visit (INDEPENDENT_AMBULATORY_CARE_PROVIDER_SITE_OTHER): Payer: Medicaid Other

## 2022-07-11 VITALS — BP 142/102 | HR 98 | Temp 98.4°F | Resp 18

## 2022-07-11 DIAGNOSIS — R0602 Shortness of breath: Secondary | ICD-10-CM

## 2022-07-11 DIAGNOSIS — J019 Acute sinusitis, unspecified: Secondary | ICD-10-CM

## 2022-07-11 DIAGNOSIS — J4521 Mild intermittent asthma with (acute) exacerbation: Secondary | ICD-10-CM

## 2022-07-11 DIAGNOSIS — R059 Cough, unspecified: Secondary | ICD-10-CM | POA: Diagnosis not present

## 2022-07-11 MED ORDER — FLUCONAZOLE 150 MG PO TABS: 150.0000 mg | ORAL_TABLET | ORAL | 0 refills | Status: AC

## 2022-07-11 MED ORDER — BENZONATATE 100 MG PO CAPS: 100.0000 mg | ORAL_CAPSULE | Freq: Three times a day (TID) | ORAL | 0 refills | Status: DC | PRN

## 2022-07-11 MED ORDER — PREDNISONE 20 MG PO TABS: 40.0000 mg | ORAL_TABLET | Freq: Every day | ORAL | 0 refills | Status: AC

## 2022-07-11 MED ORDER — ALBUTEROL SULFATE HFA 108 (90 BASE) MCG/ACT IN AERS: 2.0000 | INHALATION_SPRAY | RESPIRATORY_TRACT | 0 refills | Status: DC | PRN

## 2022-07-11 MED ORDER — AZITHROMYCIN 250 MG PO TABS: ORAL_TABLET | ORAL | 0 refills | Status: DC

## 2022-07-11 NOTE — ED Triage Notes (Signed)
Pt here for cough and congestion x 8 days

## 2022-07-11 NOTE — Discharge Instructions (Signed)
X-ray does not show any pneumonia or fluid, but it does show changes consistent with hyperinflation and asthma.  Albuterol inhaler--do 2 puffs every 4 hours as needed for shortness of breath or wheezing  Take prednisone 20 mg--2 daily for 5 days  Azithromycin 250 mg--take 2 orally the first day, then 1 daily for 4 more days.

## 2022-07-11 NOTE — ED Provider Notes (Signed)
[name]    CSN: [id number] Arrival date & time: 07/11/22  1207      History   Chief Complaint Chief Complaint  Patient presents with   Cough    Chest discomfort, Coughing up some green flem b it doesn't feel like I can get the majority of it out. Getting lightheaded from coughing. For a week now. - Entered by patient    HPI Vanessa Gallagher is a 31 y.o. female.    Cough  Here for cough and chest congestion and shortness of breath.  Symptoms have been bothering her since December 20.  No fever at any point.  She has begun to have a little nasal drainage now.  No sinus pressure currently.   Last menstrual cycle was about 3 and half weeks ago.  No nausea or vomiting or diarrhea.  She has used an inhaler previously about 3 years ago    History reviewed. No pertinent past medical history.  Patient Active Problem List   Diagnosis Date Noted   Bacterial vaginosis 02/15/2022   Pelvic pressure in female 09/03/2021   Bilateral neck pain 10/04/2020    Past Surgical History:  Procedure Laterality Date   INDUCED ABORTION      OB History     Gravida  4   Para  1   Term  1   Preterm      AB  3   Living  1      SAB  1   IAB  0   Ectopic      Multiple  0   Live Births  1            Home Medications    Prior to Admission medications   Medication Sig Start Date End Date Taking? Authorizing Provider  albuterol (VENTOLIN HFA) 108 (90 Base) MCG/ACT inhaler Inhale 2 puffs into the lungs every 4 (four) hours as needed for wheezing or shortness of breath. 07/11/22  Yes Zenia Resides, MD  azithromycin (ZITHROMAX) 250 MG tablet Take first 2 tablets together, then 1 every day until finished. 07/11/22  Yes Valisha Heslin, Janace Aris, MD  benzonatate (TESSALON) 100 MG capsule Take 1 capsule (100 mg total) by mouth 3 (three) times daily as needed for cough. 07/11/22  Yes Zenia Resides, MD  predniSONE (DELTASONE) 20 MG tablet Take 2 tablets (40 mg  total) by mouth daily with breakfast for 5 days. 07/11/22 07/16/22 Yes Zenia Resides, MD  clotrimazole (LOTRIMIN) 1 % cream Apply to affected area 2 times daily 02/20/22   Gustavus Bryant, FNP  cyclobenzaprine (FLEXERIL) 10 MG tablet Take 1 tablet (10 mg total) by mouth at bedtime. 06/22/22   Immordino, Jeannett Senior, FNP  hydrocortisone 2.5 % cream Apply topically daily. 10/18/20   Arvilla Market, MD  norelgestromin-ethinyl estradiol Burr Medico) 150-35 MCG/24HR transdermal patch Place 1 patch onto the skin once a week. 04/12/22   Georganna Skeans, MD  triamcinolone cream (KENALOG) 0.1 % Apply 1 Application topically 2 (two) times daily. 01/04/22   Margaretann Loveless, PA-C  ferrous sulfate 325 (65 FE) MG tablet Take 1 tablet (325 mg total) by mouth 3 (three) times daily with meals. 02/07/18 08/24/19  Gunnar Bulla, CNM  loratadine (CLARITIN) 10 MG tablet Take 1 tablet (10 mg total) by mouth daily. 03/05/18 08/24/19  Shambley, Melody N, CNM  ranitidine (ZANTAC) 150 MG tablet Take 1 tablet (150 mg total) by mouth 2 (two) times daily. Patient not taking: Reported on  02/04/2018 11/25/17 08/24/19  Joylene Igo, CNM    Family History Family History  Problem Relation Age of Onset   COPD Father     Social History Social History   Tobacco Use   Smoking status: Every Day    Packs/day: 0.25    Years: 10.00    Total pack years: 2.50    Types: E-cigarettes, Cigarettes    Passive exposure: Current   Smokeless tobacco: Never  Vaping Use   Vaping Use: Every day  Substance Use Topics   Alcohol use: Yes    Alcohol/week: 6.0 standard drinks of alcohol    Types: 6 Cans of beer per week    Comment: weekends   Drug use: No     Allergies   Avocado and Banana   Review of Systems Review of Systems  Respiratory:  Positive for cough.      Physical Exam Triage Vital Signs ED Triage Vitals  Enc Vitals Group     BP 07/11/22 1228 (!) 142/102     Pulse Rate 07/11/22 1228 98     Resp  07/11/22 1228 18     Temp 07/11/22 1228 98.4 F (36.9 C)     Temp Source 07/11/22 1228 Oral     SpO2 07/11/22 1228 98 %     Weight --      Height --      Head Circumference --      Peak Flow --      Pain Score 07/11/22 1229 0     Pain Loc --      Pain Edu? --      Excl. in [place]? --    No data found.  Updated Vital Signs BP (!) 142/102 (BP Location: Left Arm)   Pulse 98   Temp 98.4 F (36.9 C) (Oral)   Resp 18   LMP 06/12/2022 (Approximate)   SpO2 98%   Visual Acuity Right Eye Distance:   Left Eye Distance:   Bilateral Distance:    Right Eye Near:   Left Eye Near:    Bilateral Near:     Physical Exam Vitals reviewed.  Constitutional:      General: She is not in acute distress.    Appearance: She is not toxic-appearing.  HENT:     Right Ear: Tympanic membrane and ear canal normal.     Left Ear: Tympanic membrane and ear canal normal.     Nose: Nose normal.     Mouth/Throat:     Mouth: Mucous membranes are moist.     Pharynx: No oropharyngeal exudate or posterior oropharyngeal erythema.  Eyes:     Extraocular Movements: Extraocular movements intact.     Conjunctiva/sclera: Conjunctivae normal.     Pupils: Pupils are equal, round, and reactive to light.  Cardiovascular:     Rate and Rhythm: Normal rate and regular rhythm.     Heart sounds: No murmur heard. Pulmonary:     Effort: No respiratory distress.     Breath sounds: No stridor. No rhonchi or rales.     Comments: Is good.  She has some end expiratory low pitched wheezes bilaterally Musculoskeletal:     Cervical back: Neck supple.  Lymphadenopathy:     Cervical: No cervical adenopathy.  Skin:    Capillary Refill: Capillary refill takes less than 2 seconds.     Coloration: Skin is not jaundiced or pale.  Neurological:     General: No focal deficit present.     Mental Status:  She is alert and oriented to person, place, and time.  Psychiatric:        Behavior: Behavior normal.      UC Treatments /  Results  Labs (all labs ordered are listed, but only abnormal results are displayed) Labs Reviewed - No data to display  EKG   Radiology DG Chest 2 View  Result Date: 07/11/2022 CLINICAL DATA:  Cough and shortness of breath for 8 days EXAM: CHEST - 2 VIEW COMPARISON:  None Available. FINDINGS: Normal heart size, mediastinal contours, and pulmonary vascularity. Lungs hyperinflated but clear. No pulmonary infiltrate, pleural effusion, or pneumothorax. No acute osseous findings. IMPRESSION: Hyperinflated lungs without infiltrate. Electronically Signed   By: Lavonia Dana M.D.   On: 07/11/2022 12:55    Procedures Procedures (including critical care time)  Medications Ordered in UC Medications - No data to display  Initial Impression / Assessment and Plan / UC Course  I have reviewed the triage vital signs and the nursing notes.  Pertinent labs & imaging results that were available during my care of the patient were reviewed by me and considered in my medical decision making (see chart for details).        Chest x-ray does not show any infiltrate, but does show hyperinflation consistent with asthma exacerbation.  I am going to retreat with prednisone for asthma exacerbation with an inhaler and also antibiotics for possible sinus infection since she is developed more nasal congestion and these last few days Final Clinical Impressions(s) / UC Diagnoses   Final diagnoses:  Shortness of breath  Mild intermittent asthma with (acute) exacerbation  Acute sinusitis, recurrence not specified, unspecified location     Discharge Instructions      X-ray does not show any pneumonia or fluid, but it does show changes consistent with hyperinflation and asthma.  Albuterol inhaler--do 2 puffs every 4 hours as needed for shortness of breath or wheezing  Take prednisone 20 mg--2 daily for 5 days  Azithromycin 250 mg--take 2 orally the first day, then 1 daily for 4 more days.      ED  Prescriptions     Medication Sig Dispense Auth. Provider   azithromycin (ZITHROMAX) 250 MG tablet Take first 2 tablets together, then 1 every day until finished. 6 tablet Barrett Henle, MD   predniSONE (DELTASONE) 20 MG tablet Take 2 tablets (40 mg total) by mouth daily with breakfast for 5 days. 10 tablet Shareece Bultman, Gwenlyn Perking, MD   albuterol (VENTOLIN HFA) 108 (90 Base) MCG/ACT inhaler Inhale 2 puffs into the lungs every 4 (four) hours as needed for wheezing or shortness of breath. 1 each Barrett Henle, MD   benzonatate (TESSALON) 100 MG capsule Take 1 capsule (100 mg total) by mouth 3 (three) times daily as needed for cough. 21 capsule Barrett Henle, MD      PDMP not reviewed this encounter.   Barrett Henle, MD 07/11/22 1302

## 2022-07-18 ENCOUNTER — Ambulatory Visit: Payer: Medicaid Other

## 2023-05-11 ENCOUNTER — Ambulatory Visit (HOSPITAL_COMMUNITY)
Admission: RE | Admit: 2023-05-11 | Discharge: 2023-05-11 | Disposition: A | Discharge status: Home / Self Care | Payer: Medicaid Other | Source: Ambulatory Visit | Attending: Family Medicine | Admitting: Family Medicine

## 2023-05-11 ENCOUNTER — Ambulatory Visit (INDEPENDENT_AMBULATORY_CARE_PROVIDER_SITE_OTHER): Payer: Medicaid Other

## 2023-05-11 ENCOUNTER — Encounter (HOSPITAL_COMMUNITY): Payer: Self-pay

## 2023-05-11 VITALS — BP 129/79 | HR 78 | Temp 98.3°F | Resp 18 | Ht 64.0 in | Wt 117.0 lb

## 2023-05-11 DIAGNOSIS — S322XXA Fracture of coccyx, initial encounter for closed fracture: Secondary | ICD-10-CM | POA: Diagnosis not present

## 2023-05-11 MED ORDER — IBUPROFEN 400 MG PO TABS: 400.0000 mg | ORAL_TABLET | Freq: Three times a day (TID) | ORAL | 0 refills | Status: DC | PRN

## 2023-05-11 NOTE — Discharge Instructions (Signed)
Hello, Vanessa Gallagher. Sorry about your gluteal pain. You do have a fracture of your tailbone, as suspected.  The management is conservative.  I sent Ibuprofen and muscle relaxants to your pharmacy. See additional information about this below. Surgery is rarely required. A fractured tailbone typically heals in 4-8 weeks, but surgical intervention may take longer.  See PCP soon if there is no improvement.  Treatment for a fractured tailbone, or coccyx, focuses on managing pain and discomfort while the bone heals:   Rest Avoid physical activity, such as work, to reduce pain and encourage healing.   Ice To reduce swelling, apply an ice pack wrapped in a towel to the area for 15-20 minutes, 3-4 times a day for the first few days.   Heat Use a heating pad or take a warm bath to help ease the pain.   Pain medication Take over-the-counter pain relievers or anti-inflammatories as recommended by your doctor.   Stool softeners Take stool softeners to help with constipation and pain when using the bathroom.   Sitz bath Take a sitz bath to help with pain caused by muscle spasms.   Cushion Use a doughnut-shaped cushion or child's inner tube in your chair to reduce pressure on your tailbone.

## 2023-05-11 NOTE — ED Triage Notes (Addendum)
"  Possible fractured tail bone from large fall onto pavement directly onto tail bone and left side of bottom. I'm hearing/feeling a clicking when pressing on it. Pain when using the restroom, attempting to sit, get in and out of car, walking, etc. - Entered by patient"   Onset Thursday evening at work, she fell over a planter and landed in the left side tailbone on the pavement.   Patient requesting an x-ray be done.

## 2023-05-11 NOTE — ED Provider Notes (Addendum)
[facility]    CSN: [id number] Arrival date & time: 05/11/23  1706      History   Chief Complaint Chief Complaint  Patient presents with   Fall   Appointment    HPI Vanessa Gallagher is a 32 y.o. female.   The history is provided by the patient. No language interpreter was used.  Back Pain Location:  Sacro-iliac joint and gluteal region (C/O gluteal pain following a fall on a concrete at work 3 days ago) Quality:  Aching Radiates to:  Does not radiate Pain severity:  Severe Pain is:  Same all the time Onset quality:  Sudden Duration:  3 days Timing:  Constant Progression:  Worsening Relieved by:  Bed rest Worsened by:  Bowel movement, sitting and movement Associated symptoms comment:  Tylenol Concrete plan fall on con - tailbone hurts. Tylenol not helpful. Thursday at work. Driving is difficult  History reviewed. No pertinent past medical history.  Patient Active Problem List   Diagnosis Date Noted   Bacterial vaginosis 02/15/2022   Pelvic pressure in female 09/03/2021   Bilateral neck pain 10/04/2020    Past Surgical History:  Procedure Laterality Date   INDUCED ABORTION      OB History     Gravida  4   Para  1   Term  1   Preterm      AB  3   Living  1      SAB  1   IAB  0   Ectopic      Multiple  0   Live Births  1            Home Medications    Prior to Admission medications   Medication Sig Start Date End Date Taking? Authorizing Provider  ibuprofen (ADVIL) 400 MG tablet Take 1 tablet (400 mg total) by mouth every 8 (eight) hours as needed. 05/11/23  Yes Doreene Eland, MD  albuterol (VENTOLIN HFA) 108 (90 Base) MCG/ACT inhaler Inhale 2 puffs into the lungs every 4 (four) hours as needed for wheezing or shortness of breath. 07/11/22   Zenia Resides, MD  azithromycin (ZITHROMAX) 250 MG tablet Take first 2 tablets together, then 1 every day until finished. 07/11/22   Zenia Resides, MD  benzonatate  (TESSALON) 100 MG capsule Take 1 capsule (100 mg total) by mouth 3 (three) times daily as needed for cough. 07/11/22   Zenia Resides, MD  clotrimazole (LOTRIMIN) 1 % cream Apply to affected area 2 times daily 02/20/22   Gustavus Bryant, FNP  cyclobenzaprine (FLEXERIL) 10 MG tablet Take 1 tablet (10 mg total) by mouth at bedtime. 06/22/22   Immordino, Jeannett Senior, FNP  hydrocortisone 2.5 % cream Apply topically daily. 10/18/20   Arvilla Market, MD  norelgestromin-ethinyl estradiol Burr Medico) 150-35 MCG/24HR transdermal patch Place 1 patch onto the skin once a week. 04/12/22   Georganna Skeans, MD  triamcinolone cream (KENALOG) 0.1 % Apply 1 Application topically 2 (two) times daily. 01/04/22   Margaretann Loveless, PA-C  ferrous sulfate 325 (65 FE) MG tablet Take 1 tablet (325 mg total) by mouth 3 (three) times daily with meals. 02/07/18 08/24/19  Gunnar Bulla, CNM  loratadine (CLARITIN) 10 MG tablet Take 1 tablet (10 mg total) by mouth daily. 03/05/18 08/24/19  Shambley, Melody N, CNM  ranitidine (ZANTAC) 150 MG tablet Take 1 tablet (150 mg total) by mouth 2 (two) times daily. Patient not taking: Reported on 02/04/2018 11/25/17 08/24/19  Shambley, Melody N, CNM    Family History Family History  Problem Relation Age of Onset   COPD Father     Social History Social History   Tobacco Use   Smoking status: Every Day    Current packs/day: 0.25    Average packs/day: 0.3 packs/day for 10.0 years (2.5 ttl pk-yrs)    Types: E-cigarettes, Cigarettes    Passive exposure: Current   Smokeless tobacco: Never  Vaping Use   Vaping status: Every Day  Substance Use Topics   Alcohol use: Yes    Alcohol/week: 6.0 standard drinks of alcohol    Types: 6 Cans of beer per week    Comment: weekends   Drug use: No     Allergies   Avocado and Banana   Review of Systems Review of Systems  Musculoskeletal:  Positive for back pain.     Physical Exam Triage Vital Signs ED Triage Vitals   Encounter Vitals Group     BP 05/11/23 1720 129/79     Systolic BP Percentile --      Diastolic BP Percentile --      Pulse Rate 05/11/23 1720 78     Resp 05/11/23 1720 18     Temp 05/11/23 1720 98.3 F (36.8 C)     Temp Source 05/11/23 1720 Oral     SpO2 05/11/23 1720 99 %     Weight 05/11/23 1719 117 lb (53.1 kg)     Height 05/11/23 1719 5\' 4"  (1.626 m)     Head Circumference --      Peak Flow --      Pain Score 05/11/23 1717 7     Pain Loc --      Pain Education --      Exclude from Growth Chart --    No data found.  Updated Vital Signs BP 129/79 (BP Location: Right Wrist)   Pulse 78   Temp 98.3 F (36.8 C) (Oral)   Resp 18   Ht 5\' 4"  (1.626 m)   Wt 53.1 kg   LMP 03/31/2023 (Approximate)   SpO2 99%   BMI 20.08 kg/m   Visual Acuity Right Eye Distance:   Left Eye Distance:   Bilateral Distance:    Right Eye Near:   Left Eye Near:    Bilateral Near:     Physical Exam Vitals and nursing note reviewed.  Cardiovascular:     Rate and Rhythm: Normal rate and regular rhythm.     Heart sounds: Normal heart sounds. No murmur heard. Pulmonary:     Effort: Pulmonary effort is normal. No respiratory distress.     Breath sounds: Normal breath sounds.  Musculoskeletal:     Lumbar back: No swelling, deformity, signs of trauma or tenderness. Normal range of motion.     Comments: Gait is steady but slowed due to pain. Severe tenderness of her Sacrococcygeal joint      UC Treatments / Results  Labs (all labs ordered are listed, but only abnormal results are displayed) Labs Reviewed - No data to display  EKG   Radiology DG Sacrum/Coccyx  Result Date: 05/11/2023 CLINICAL DATA:  Fall with coccygeal pain EXAM: SACRUM AND COCCYX - 2+ VIEW COMPARISON:  None Available. FINDINGS: Possible acute nondisplaced sacrococcygeal fracture best seen on lateral view. SI joints are non widened. IMPRESSION: Possible acute nondisplaced sacrococcygeal fracture. Electronically Signed    By: Jasmine Pang M.D.   On: 05/11/2023 18:01    Procedures Procedures (including critical care time)  Medications Ordered in UC Medications - No data to display  Initial Impression / Assessment and Plan / UC Course  I have reviewed the triage vital signs and the nursing notes.  Pertinent labs & imaging results that were available during my care of the patient were reviewed by me and considered in my medical decision making (see chart for details).  Clinical Course as of 05/11/23 1814  Sun May 11, 2023  1808 Coccygeal fracture Xray reviewed and discussed with her Conservative measured discussed Ibuprofen prn pain escribed PCP f/u for monitoring recommended F/U as needed [KE]    Clinical Course User Index [KE] Doreene Eland, MD   Final Clinical Impressions(s) / UC Diagnoses   Final diagnoses:  Closed fracture of coccyx, initial encounter [facility])     Discharge Instructions      Hello, Ms. Meras. Sorry about your gluteal pain. You do have a fracture of your tailbone, as suspected.  The management is conservative.  I sent Ibuprofen and muscle relaxants to your pharmacy. See additional information about this below. Surgery is rarely required. A fractured tailbone typically heals in 4-8 weeks, but surgical intervention may take longer.  See PCP soon if there is no improvement.  Treatment for a fractured tailbone, or coccyx, focuses on managing pain and discomfort while the bone heals:   Rest Avoid physical activity, such as work, to reduce pain and encourage healing.   Ice To reduce swelling, apply an ice pack wrapped in a towel to the area for 15-20 minutes, 3-4 times a day for the first few days.   Heat Use a heating pad or take a warm bath to help ease the pain.   Pain medication Take over-the-counter pain relievers or anti-inflammatories as recommended by your doctor.   Stool softeners Take stool softeners to help with constipation and pain when using the  bathroom.   Sitz bath Take a sitz bath to help with pain caused by muscle spasms.   Cushion Use a doughnut-shaped cushion or child's inner tube in your chair to reduce pressure on your tailbone.       ED Prescriptions     Medication Sig Dispense Auth. Provider   ibuprofen (ADVIL) 400 MG tablet Take 1 tablet (400 mg total) by mouth every 8 (eight) hours as needed. 30 tablet Doreene Eland, MD      PDMP not reviewed this encounter.   Doreene Eland, MD 05/11/23 Elfredia Nevins    Doreene Eland, MD 05/11/23 [phone]

## 2024-03-15 ENCOUNTER — Telehealth: Admitting: Physician Assistant

## 2024-03-15 DIAGNOSIS — N3090 Cystitis, unspecified without hematuria: Secondary | ICD-10-CM

## 2024-03-15 MED ORDER — CEPHALEXIN 500 MG PO CAPS: 500.0000 mg | ORAL_CAPSULE | Freq: Two times a day (BID) | ORAL | 0 refills | Status: AC

## 2024-03-15 NOTE — Patient Instructions (Signed)
  Lauraine JAYSON Shallow, thank you for joining Lynden GORMAN Snuffer, PA-C for today's virtual visit.  While this provider is not your primary care provider (PCP), if your PCP is located in our provider database this encounter information will be shared with them immediately following your visit.   A [facility] MyChart account gives you access to today's visit and all your visits, tests, and labs performed at [facility]  click here if you don't have a [facility] MyChart account or go to [email or web address]  Consent: (Patient) Vanessa Gallagher provided verbal consent for this virtual visit at the beginning of the encounter.  Current Medications:  Current Outpatient Medications:    albuterol  (VENTOLIN  HFA) 108 (90 Base) MCG/ACT inhaler, Inhale 2 puffs into the lungs every 4 (four) hours as needed for wheezing or shortness of breath., Disp: 1 each, Rfl: 0   azithromycin  (ZITHROMAX ) 250 MG tablet, Take first 2 tablets together, then 1 every day until finished., Disp: 6 tablet, Rfl: 0   benzonatate  (TESSALON ) 100 MG capsule, Take 1 capsule (100 mg total) by mouth 3 (three) times daily as needed for cough., Disp: 21 capsule, Rfl: 0   clotrimazole  (LOTRIMIN ) 1 % cream, Apply to affected area 2 times daily, Disp: 15 g, Rfl: 0   cyclobenzaprine  (FLEXERIL ) 10 MG tablet, Take 1 tablet (10 mg total) by mouth at bedtime., Disp: 30 tablet, Rfl: 0   hydrocortisone  2.5 % cream, Apply topically daily., Disp: 30 g, Rfl: 0   ibuprofen  (ADVIL ) 400 MG tablet, Take 1 tablet (400 mg total) by mouth every 8 (eight) hours as needed., Disp: 30 tablet, Rfl: 0   norelgestromin -ethinyl estradiol  (XULANE) 150-35 MCG/24HR transdermal patch, Place 1 patch onto the skin once a week., Disp: 3 patch, Rfl: 15   triamcinolone  cream (KENALOG ) 0.1 %, Apply 1 Application topically 2 (two) times daily., Disp: 30 g, Rfl: 0   Medications ordered in this encounter:  No orders of the defined types were placed in this encounter.     *If you need refills on other medications prior to your next appointment, please contact your pharmacy*  Follow-Up: Call back or seek an in-person evaluation if the symptoms worsen or if the condition fails to improve as anticipated.  [facility] [phone]  Other Instructions You were given a prescription for antibiotics. Please take the antibiotic prescription fully.   Follow up with your regular doctor in 1 week for reassessment and seek care sooner if your symptoms worsen or fail to improve.     If you have been instructed to have an in-person evaluation today at a local Urgent Care facility, please use the link below. It will take you to a list of all of our available [facility], including address, phone number and hours of operation. Please do not delay care.  [facility]  If you or a family member do not have a primary care provider, use the link below to schedule a visit and establish care. When you choose a [facility] primary care physician or advanced practice provider, you gain a long-term partner in health. Find a Primary Care Provider  Learn more about 's in-office and virtual care options:  - Get Care Now

## 2024-03-15 NOTE — Progress Notes (Signed)
 Vanessa Gallagher, Vanessa Gallagher are scheduled for a virtual visit with your provider today.    Just as we do with appointments in the office, we must obtain your consent to participate.  Your consent will be active for this visit and any virtual visit you may have with one of our providers in the next 365 days.    If you have a MyChart account, I can also send a copy of this consent to you electronically.  All virtual visits are billed to your insurance company just like a traditional visit in the office.  As this is a virtual visit, video technology does not allow for your provider to perform a traditional examination.  This may limit your provider's ability to fully assess your condition.  If your provider identifies any concerns that need to be evaluated in person or the need to arrange testing such as labs, EKG, etc, we will make arrangements to do so.    Although advances in technology are sophisticated, we cannot ensure that it will always work on either your end or our end.  If the connection with a video visit is poor, we may have to switch to a telephone visit.  With either a video or telephone visit, we are not always able to ensure that we have a secure connection.   I need to obtain your verbal consent now.   Are you willing to proceed with your visit today?   Vanessa Gallagher has provided verbal consent on 03/15/2024 for a virtual visit (video or telephone).   Vanessa GORMAN Snuffer, PA-C 03/15/2024  3:44 PM   Date:  03/15/2024   ID:  Vanessa Gallagher, DOB 1990-10-14, MRN [id number]  Patient Location: Home Provider Location: Home Office   Participants: Patient and Provider for Visit and Wrap up  Method of visit: Video  Location of Patient: Home Location of Provider: Home Office Consent was obtain for visit over the video. Services rendered by provider: Visit was performed via video  A video enabled telemedicine application was used and I verified that I am speaking with the correct person using two  identifiers.  PCP:  Tanda Bleacher, MD   Chief Complaint:  uti sxs  History of Present Illness:    Vanessa Gallagher is a 33 y.o. female with history as stated below. Presents video telehealth for an acute care visit  Pt reports uti sxs that started yesterday. She reports hematuria, dysuria, frequency, suprapubic pressure, nausea and urgency. Denies fevers, vomiting, severe abd pain, vaginal discharge.   Does not believe she is pregnant. Has hx of uti and this feels similar  Past Medical, Surgical, Social History, Allergies, and Medications have been Reviewed.  History reviewed. No pertinent past medical history.  No outpatient medications have been marked as taking for the 03/15/24 encounter (Appointment) with [facility] PROVIDER.     Allergies:   Avocado and Banana   ROS See HPI for history of present illness.  Physical Exam Constitutional:      Appearance: Normal appearance.  Pulmonary:     Effort: Pulmonary effort is normal.  Neurological:     Mental Status: She is alert.             MDM: Pt with uti sxs, hx uti and this feels the same. Lower suspicion for pyelonephritis. Will tx with abx.   Tests Ordered: No orders of the defined types were placed in this encounter.   Medication Changes: No orders of the defined types were placed in this encounter.  Disposition:  Follow up  Signed, Vanessa GORMAN Snuffer, PA-C  03/15/2024 3:44 PM

## 2024-05-13 ENCOUNTER — Ambulatory Visit (INDEPENDENT_AMBULATORY_CARE_PROVIDER_SITE_OTHER): Admitting: Family Medicine

## 2024-05-13 VITALS — BP 137/97 | HR 81 | Ht 64.0 in | Wt 112.6 lb

## 2024-05-13 DIAGNOSIS — R1030 Lower abdominal pain, unspecified: Secondary | ICD-10-CM | POA: Diagnosis not present

## 2024-05-17 ENCOUNTER — Encounter: Payer: Self-pay | Admitting: Family Medicine

## 2024-05-17 ENCOUNTER — Other Ambulatory Visit

## 2024-05-17 NOTE — Progress Notes (Signed)
 New Patient Office Visit  Subjective    Patient ID: Vanessa Gallagher, female    DOB: Feb 25, 1991  Age: 33 y.o. MRN: [id number]  CC:  Chief Complaint  Patient presents with   lower abdominal issues     HPI Vanessa Gallagher presents to establish care with complaint of lower abdominal pain. Patient reports that she has had UTI in the past. She denies dysuria. She denies GU sx.    Outpatient Encounter Medications as of 05/13/2024  Medication Sig   albuterol  (VENTOLIN  HFA) 108 (90 Base) MCG/ACT inhaler Inhale 2 puffs into the lungs every 4 (four) hours as needed for wheezing or shortness of breath.   azithromycin  (ZITHROMAX ) 250 MG tablet Take first 2 tablets together, then 1 every day until finished.   benzonatate  (TESSALON ) 100 MG capsule Take 1 capsule (100 mg total) by mouth 3 (three) times daily as needed for cough.   clotrimazole  (LOTRIMIN ) 1 % cream Apply to affected area 2 times daily   cyclobenzaprine  (FLEXERIL ) 10 MG tablet Take 1 tablet (10 mg total) by mouth at bedtime.   hydrocortisone  2.5 % cream Apply topically daily.   ibuprofen  (ADVIL ) 400 MG tablet Take 1 tablet (400 mg total) by mouth every 8 (eight) hours as needed.   norelgestromin -ethinyl estradiol  (XULANE) 150-35 MCG/24HR transdermal patch Place 1 patch onto the skin once a week.   triamcinolone  cream (KENALOG ) 0.1 % Apply 1 Application topically 2 (two) times daily.   [DISCONTINUED] ferrous sulfate  325 (65 FE) MG tablet Take 1 tablet (325 mg total) by mouth 3 (three) times daily with meals.   [DISCONTINUED] loratadine  (CLARITIN ) 10 MG tablet Take 1 tablet (10 mg total) by mouth daily.   [DISCONTINUED] ranitidine  (ZANTAC ) 150 MG tablet Take 1 tablet (150 mg total) by mouth 2 (two) times daily. (Patient not taking: Reported on 02/04/2018)   No facility-administered encounter medications on file as of 05/13/2024.    History reviewed. No pertinent past medical history.  Past Surgical History:  Procedure Laterality Date    INDUCED ABORTION      Family History  Problem Relation Age of Onset   COPD Father     Social History   Socioeconomic History   Marital status: Single    Spouse name: Not on file   Number of children: 1   Years of education: Not on file   Highest education level: 10th grade  Occupational History   Not on file  Tobacco Use   Smoking status: Every Day    Current packs/day: 0.25    Average packs/day: 0.3 packs/day for 10.0 years (2.5 ttl pk-yrs)    Types: E-cigarettes, Cigarettes    Passive exposure: Current   Smokeless tobacco: Never  Vaping Use   Vaping status: Every Day  Substance and Sexual Activity   Alcohol use: Yes    Alcohol/week: 6.0 standard drinks of alcohol    Types: 6 Cans of beer per week    Comment: weekends   Drug use: No   Sexual activity: Yes    Birth control/protection: Patch  Other Topics Concern   Not on file  Social History Narrative   Not on file   Social Drivers of Health   Financial Resource Strain: Medium Risk (05/13/2024)   Overall Financial Resource Strain (CARDIA)    Difficulty of Paying Living Expenses: Somewhat hard  Food Insecurity: Food Insecurity Present (05/13/2024)   Hunger Vital Sign    Worried About Running Out of Food in the Last Year: Sometimes  true    Ran Out of Food in the Last Year: Never true  Transportation Needs: No Transportation Needs (05/13/2024)   PRAPARE - Administrator, Civil Service (Medical): No    Lack of Transportation (Non-Medical): No  Physical Activity: Sufficiently Active (05/13/2024)   Exercise Vital Sign    Days of Exercise per Week: 5 days    Minutes of Exercise per Session: 60 min  Stress: Stress Concern Present (05/13/2024)   [employer] of Occupational Health - Occupational Stress Questionnaire    Feeling of Stress: To some extent  Social Connections: Moderately Integrated (05/13/2024)   Social Connection and Isolation Panel    Frequency of Communication with Friends and  Family: More than three times a week    Frequency of Social Gatherings with Friends and Family: More than three times a week    Attends Religious Services: 1 to 4 times per year    Active Member of [employer] or Organizations: No    Attends Engineer, Structural: Not on file    Marital Status: Living with partner  Intimate Partner Violence: Not on file    Review of Systems  Gastrointestinal:  Positive for abdominal pain.  All other systems reviewed and are negative.       Objective   BP (!) 137/97   Pulse 81   Ht 5' 4 (1.626 m)   Wt 112 lb 9.6 oz (51.1 kg)   LMP 04/23/2024 (Approximate)   SpO2 100%   BMI 19.33 kg/m   Physical Exam Vitals and nursing note reviewed.  Constitutional:      General: She is not in acute distress. Cardiovascular:     Rate and Rhythm: Normal rate and regular rhythm.  Pulmonary:     Effort: Pulmonary effort is normal.     Breath sounds: Normal breath sounds.  Abdominal:     Palpations: Abdomen is soft.     Tenderness: There is abdominal tenderness in the right lower quadrant and suprapubic area.  Neurological:     General: No focal deficit present.     Mental Status: She is alert and oriented to person, place, and time.         Assessment & Plan:   Lower abdominal pain   Recommend follow up for CPE with labs. Consider GYN vs imaging for further eval/mgt  Return for physical.   Tanda Raguel SQUIBB, MD

## 2024-05-18 ENCOUNTER — Ambulatory Visit
Admission: RE | Admit: 2024-05-18 | Discharge: 2024-05-18 | Disposition: A | Discharge status: Home / Self Care | Source: Ambulatory Visit | Attending: Family Medicine | Admitting: Family Medicine

## 2024-05-18 VITALS — BP 139/89 | HR 82 | Temp 98.9°F | Resp 16

## 2024-05-18 DIAGNOSIS — N760 Acute vaginitis: Secondary | ICD-10-CM | POA: Insufficient documentation

## 2024-05-18 DIAGNOSIS — Z113 Encounter for screening for infections with a predominantly sexual mode of transmission: Secondary | ICD-10-CM | POA: Insufficient documentation

## 2024-05-18 LAB — POCT URINE DIPSTICK
Bilirubin, UA: NEGATIVE
Blood, UA: NEGATIVE
Glucose, UA: NEGATIVE mg/dL
Ketones, POC UA: NEGATIVE mg/dL
Nitrite, UA: NEGATIVE
POC PROTEIN,UA: NEGATIVE
Spec Grav, UA: 1.02 (ref 1.010–1.025)
Urobilinogen, UA: 0.2 U/dL
pH, UA: 6 (ref 5.0–8.0)

## 2024-05-18 LAB — POCT URINE PREGNANCY: Preg Test, Ur: NEGATIVE

## 2024-05-18 NOTE — Discharge Instructions (Addendum)
 Make sure you hydrate very well with plain water and a quantity of 80 ounces of water a day.  Please limit drinks that are considered urinary irritants such as fruit juices, soda, sweet tea, coffee, artifical sweetened drinks, energy drinks, alcohol.  These can worsen your urinary and genital symptoms but also be the source of them.  I will let you know about your vaginal swab and urine culture results through MyChart to see if we need to prescribe or change your antibiotics based off of those results.

## 2024-05-18 NOTE — ED Provider Notes (Signed)
 [facility]  Note:  This document was prepared using Conservation officer, historic buildings and may include unintentional dictation errors.  MRN: [id number] DOB: 21-Apr-1991  Subjective:   Vanessa Gallagher is a 33 y.o. female presenting for 40 history of very thick vaginal fluid but not discharge.  Symptoms did occur during sex as well.  Has 1 partner, believes it is a monogamous relationship but would like to make sure about sexually transmitted infections.  Denies fever, n/v, abdominal pain, rashes, dysuria, urinary frequency, hematuria, vaginal malodor.  She would like to be checked for UTIs.  Has a history of this.  Tries to hydrate but also drinks coffee, energy drinks, occasional soda.  No active urinary symptoms.  However, she has had mild intermittent right sided pelvic pains.  Reported this to her PCP and was referred to a gynecologist.  No current facility-administered medications for this encounter. No current outpatient medications on file.   Allergies  Allergen Reactions   Avocado Itching   Banana Itching    History reviewed. No pertinent past medical history.   Past Surgical History:  Procedure Laterality Date   INDUCED ABORTION      Family History  Problem Relation Age of Onset   COPD Father     Social History   Tobacco Use   Smoking status: Every Day    Current packs/day: 0.25    Average packs/day: 0.3 packs/day for 10.0 years (2.5 ttl pk-yrs)    Types: E-cigarettes, Cigarettes    Passive exposure: Current   Smokeless tobacco: Never  Vaping Use   Vaping status: Every Day  Substance Use Topics   Alcohol use: Yes    Alcohol/week: 6.0 standard drinks of alcohol    Types: 6 Cans of beer per week    Comment: weekends   Drug use: No    ROS   Objective:   Vitals: BP 139/89 (BP Location: Right Arm)   Pulse 82   Temp 98.9 F (37.2 C) (Oral)   Resp 16   LMP 04/23/2024 (Exact Date)   SpO2 99%   Physical Exam Constitutional:       General: She is not in acute distress.    Appearance: Normal appearance. She is well-developed. She is not ill-appearing, toxic-appearing or diaphoretic.  HENT:     Head: Normocephalic and atraumatic.     Nose: Nose normal.     Mouth/Throat:     Mouth: Mucous membranes are moist.     Pharynx: Oropharynx is clear.  Eyes:     General: No scleral icterus.       Right eye: No discharge.        Left eye: No discharge.     Extraocular Movements: Extraocular movements intact.     Conjunctiva/sclera: Conjunctivae normal.  Cardiovascular:     Rate and Rhythm: Normal rate.  Pulmonary:     Effort: Pulmonary effort is normal.  Abdominal:     General: Bowel sounds are normal. There is no distension.     Palpations: Abdomen is soft. There is no mass.     Tenderness: There is no abdominal tenderness. There is no right CVA tenderness, left CVA tenderness, guarding or rebound.  Skin:    General: Skin is warm and dry.  Neurological:     General: No focal deficit present.     Mental Status: She is alert and oriented to person, place, and time.  Psychiatric:        Mood and Affect: Mood  normal.        Behavior: Behavior normal.        Thought Content: Thought content normal.        Judgment: Judgment normal.     Results for orders placed or performed during the hospital encounter of 05/18/24 (from the past 24 hours)  POCT urine pregnancy     Status: None   Collection Time: 05/18/24  8:51 AM  Result Value Ref Range   Preg Test, Ur Negative Negative  POCT URINE DIPSTICK     Status: Abnormal   Collection Time: 05/18/24  8:51 AM  Result Value Ref Range   Color, UA yellow yellow   Clarity, UA clear clear   Glucose, UA negative negative mg/dL   Bilirubin, UA negative negative   Ketones, POC UA negative negative mg/dL   Spec Grav, UA 8.979 8.989 - 1.025   Blood, UA negative negative   pH, UA 6.0 5.0 - 8.0   POC PROTEIN,UA negative negative, trace   Urobilinogen, UA 0.2 0.2 or 1.0 E.U./dL    Nitrite, UA Negative Negative   Leukocytes, UA Trace (A) Negative    Assessment and Plan :   PDMP not reviewed this encounter.  1. Acute vaginitis   2. Screen for STD (sexually transmitted disease)    Will base treatment of her results.  Labs pending.  Avoid urinary irritants, hydrate consistently.   Christopher Savannah, [place] 05/18/24 [phone]

## 2024-05-18 NOTE — ED Triage Notes (Signed)
 Pt reports white thick fluid not discharge vaginal x 4 days. Denies itching or burning. Reports right lower quadrant discomfort,on and off, she think mat be an ovarian cyst she will speak with her OBGYN.  Pt think she may be pregnant.

## 2024-05-26 ENCOUNTER — Telehealth: Payer: Self-pay

## 2024-05-26 MED ORDER — FLUCONAZOLE 150 MG PO TABS: 150.0000 mg | ORAL_TABLET | ORAL | 0 refills | Status: AC

## 2024-05-26 MED ORDER — METRONIDAZOLE 500 MG PO TABS: 500.0000 mg | ORAL_TABLET | Freq: Two times a day (BID) | ORAL | 0 refills | Status: AC

## 2024-05-26 NOTE — Telephone Encounter (Signed)
 Spoke with pt at length r/t to differing answers from lab about cyto swab collection/results-advised pt to come in for a recollection cyto swab. Pt states she feels she has BV and has been using boric acid suppositories. Pt concerned the boric acid may affect test results. Discussed all of the above with [place], PA C. He is agreeable to send rx for BV.  Pt notified and is agreeable and requests rx for yeast as well. Pt states she has appt with PCP 11/17 and will have f/u prn instead of returning to UC for cyto recollection.

## 2024-05-26 NOTE — Telephone Encounter (Signed)
 Rx for BV will be sent to pharmacy on file. Will supplement treatment with fluconazole  to help with secondary antibiotic associated yeast infection.

## 2024-05-26 NOTE — Telephone Encounter (Signed)
 [facility] called back and stated they had not finalized the results because they have not been able to finalize the result for gonorhhea. The tech did provide a verbal result for the other testing as follows: BV POSITIVE Candida NEGATIVE Trichomonas NEGATIVE  Chlamydia NEGATIVE Gonorrhea in process  Tech stated the testing for Gonorrhea continued to fail with their instrumentation, but that they would continue to run the test until there was no sample left.

## 2024-05-27 ENCOUNTER — Ambulatory Visit (HOSPITAL_COMMUNITY): Payer: Self-pay

## 2024-05-27 LAB — CERVICOVAGINAL ANCILLARY ONLY
Bacterial Vaginitis (gardnerella): POSITIVE — AB
Candida Glabrata: NEGATIVE
Candida Vaginitis: NEGATIVE
Chlamydia: NEGATIVE
Comment: NEGATIVE
Comment: NEGATIVE
Comment: NEGATIVE
Comment: NEGATIVE
Comment: NEGATIVE
Comment: NORMAL
Neisseria Gonorrhea: NEGATIVE
Trichomonas: NEGATIVE

## 2024-05-31 ENCOUNTER — Encounter: Payer: Self-pay | Admitting: Family Medicine

## 2024-05-31 ENCOUNTER — Ambulatory Visit (INDEPENDENT_AMBULATORY_CARE_PROVIDER_SITE_OTHER): Admitting: Family Medicine

## 2024-05-31 VITALS — BP 130/86 | HR 76 | Ht 64.0 in | Wt 112.0 lb

## 2024-05-31 DIAGNOSIS — Z130 Encounter for screening for diseases of the blood and blood-forming organs and certain disorders involving the immune mechanism: Secondary | ICD-10-CM | POA: Diagnosis not present

## 2024-05-31 DIAGNOSIS — Z0000 Encounter for general adult medical examination without abnormal findings: Secondary | ICD-10-CM | POA: Diagnosis not present

## 2024-05-31 DIAGNOSIS — Z136 Encounter for screening for cardiovascular disorders: Secondary | ICD-10-CM

## 2024-05-31 NOTE — Progress Notes (Signed)
 Established Patient Office Visit  Subjective    Patient ID: Vanessa Gallagher, female    DOB: 1990-11-03  Age: 33 y.o. MRN: [id number]  CC:  Chief Complaint  Patient presents with   Annual Exam    HPI Vanessa Gallagher presents for routine annual exam. Patient denies acute complaints.   Outpatient Encounter Medications as of 05/31/2024  Medication Sig   fluconazole  (DIFLUCAN ) 150 MG tablet Take 1 tablet (150 mg total) by mouth once a week.   metroNIDAZOLE  (FLAGYL ) 500 MG tablet Take 1 tablet (500 mg total) by mouth 2 (two) times daily.   [DISCONTINUED] ferrous sulfate  325 (65 FE) MG tablet Take 1 tablet (325 mg total) by mouth 3 (three) times daily with meals.   [DISCONTINUED] loratadine  (CLARITIN ) 10 MG tablet Take 1 tablet (10 mg total) by mouth daily.   [DISCONTINUED] ranitidine  (ZANTAC ) 150 MG tablet Take 1 tablet (150 mg total) by mouth 2 (two) times daily. (Patient not taking: Reported on 02/04/2018)   No facility-administered encounter medications on file as of 05/31/2024.    History reviewed. No pertinent past medical history.  Past Surgical History:  Procedure Laterality Date   INDUCED ABORTION      Family History  Problem Relation Age of Onset   COPD Father     Social History   Socioeconomic History   Marital status: Single    Spouse name: Not on file   Number of children: 1   Years of education: Not on file   Highest education level: 10th grade  Occupational History   Not on file  Tobacco Use   Smoking status: Every Day    Current packs/day: 0.25    Average packs/day: 0.3 packs/day for 10.0 years (2.5 ttl pk-yrs)    Types: E-cigarettes, Cigarettes    Passive exposure: Current   Smokeless tobacco: Never  Vaping Use   Vaping status: Every Day  Substance and Sexual Activity   Alcohol use: Yes    Alcohol/week: 6.0 standard drinks of alcohol    Types: 6 Cans of beer per week    Comment: weekends   Drug use: No   Sexual activity: Yes  Other Topics Concern    Not on file  Social History Narrative   Not on file   Social Drivers of Health   Financial Resource Strain: Medium Risk (05/13/2024)   Overall Financial Resource Strain (CARDIA)    Difficulty of Paying Living Expenses: Somewhat hard  Food Insecurity: Food Insecurity Present (05/13/2024)   Hunger Vital Sign    Worried About Running Out of Food in the Last Year: Sometimes true    Ran Out of Food in the Last Year: Never true  Transportation Needs: No Transportation Needs (05/13/2024)   PRAPARE - Administrator, Civil Service (Medical): No    Lack of Transportation (Non-Medical): No  Physical Activity: Sufficiently Active (05/13/2024)   Exercise Vital Sign    Days of Exercise per Week: 5 days    Minutes of Exercise per Session: 60 min  Stress: Stress Concern Present (05/13/2024)   [employer] of Occupational Health - Occupational Stress Questionnaire    Feeling of Stress: To some extent  Social Connections: Moderately Integrated (05/13/2024)   Social Connection and Isolation Panel    Frequency of Communication with Friends and Family: More than three times a week    Frequency of Social Gatherings with Friends and Family: More than three times a week    Attends Religious Services: 1 to  4 times per year    Active Member of Clubs or Organizations: No    Attends Banker Meetings: Not on file    Marital Status: Living with partner  Intimate Partner Violence: Not on file    Review of Systems  All other systems reviewed and are negative.       Objective    BP 130/86   Pulse 76   Ht 5' 4 (1.626 m)   Wt 112 lb (50.8 kg)   LMP 04/23/2024 (Exact Date)   SpO2 100%   BMI 19.22 kg/m   Physical Exam Vitals and nursing note reviewed.  Constitutional:      General: She is not in acute distress. HENT:     Head: Normocephalic and atraumatic.     Right Ear: Tympanic membrane, ear canal and external ear normal.     Left Ear: Tympanic membrane, ear  canal and external ear normal.     Nose: Nose normal.     Mouth/Throat:     Mouth: Mucous membranes are moist.     Pharynx: Oropharynx is clear.  Eyes:     Conjunctiva/sclera: Conjunctivae normal.     Pupils: Pupils are equal, round, and reactive to light.  Neck:     Thyroid: No thyromegaly.  Cardiovascular:     Rate and Rhythm: Normal rate and regular rhythm.     Heart sounds: Normal heart sounds. No murmur heard. Pulmonary:     Effort: Pulmonary effort is normal. No respiratory distress.     Breath sounds: Normal breath sounds.  Abdominal:     General: There is no distension.     Palpations: Abdomen is soft. There is no mass.     Tenderness: There is no abdominal tenderness.  Musculoskeletal:        General: Normal range of motion.     Cervical back: Normal range of motion and neck supple.  Skin:    General: Skin is warm and dry.  Neurological:     General: No focal deficit present.     Mental Status: She is alert and oriented to person, place, and time.  Psychiatric:        Mood and Affect: Mood normal.        Behavior: Behavior normal.         Assessment & Plan:   Annual physical exam -     Comprehensive metabolic panel with GFR  Screening for deficiency anemia -     CBC  Encounter for screening for cardiovascular disorders -     Lipid panel     No follow-ups on file.   Tanda Raguel SQUIBB, MD

## 2024-06-01 LAB — COMPREHENSIVE METABOLIC PANEL WITH GFR
ALT: 18 IU/L (ref 0–32)
AST: 31 IU/L (ref 0–40)
Albumin: 4.5 g/dL (ref 3.9–4.9)
Alkaline Phosphatase: 75 IU/L (ref 41–116)
BUN/Creatinine Ratio: 13 (ref 9–23)
BUN: 12 mg/dL (ref 6–20)
Bilirubin Total: 0.6 mg/dL (ref 0.0–1.2)
CO2: 22 mmol/L (ref 20–29)
Calcium: 9.4 mg/dL (ref 8.7–10.2)
Chloride: 99 mmol/L (ref 96–106)
Creatinine, Ser: 0.94 mg/dL (ref 0.57–1.00)
Globulin, Total: 2.9 g/dL (ref 1.5–4.5)
Glucose: 72 mg/dL (ref 70–99)
Potassium: 4.4 mmol/L (ref 3.5–5.2)
Sodium: 138 mmol/L (ref 134–144)
Total Protein: 7.4 g/dL (ref 6.0–8.5)
eGFR: 83 mL/min/1.73 (ref >59)

## 2024-06-01 LAB — CBC
Hematocrit: 42 % (ref 34.0–46.6)
Hemoglobin: 14.1 g/dL (ref 11.1–15.9)
MCH: 33.1 pg — ABNORMAL HIGH (ref 26.6–33.0)
MCHC: 33.6 g/dL (ref 31.5–35.7)
MCV: 99 fL — ABNORMAL HIGH (ref 79–97)
Platelets: 235 x10E3/uL (ref 150–450)
RBC: 4.26 x10E6/uL (ref 3.77–5.28)
RDW: 11.9 % (ref 11.7–15.4)
WBC: 5 x10E3/uL (ref 3.4–10.8)

## 2024-06-01 LAB — LIPID PANEL
Chol/HDL Ratio: 1.8 ratio (ref 0.0–4.4)
Cholesterol, Total: 167 mg/dL (ref 100–199)
HDL: 91 mg/dL (ref >39)
LDL Chol Calc (NIH): 66 mg/dL (ref 0–99)
Triglycerides: 48 mg/dL (ref 0–149)
VLDL Cholesterol Cal: 10 mg/dL (ref 5–40)

## 2024-06-02 ENCOUNTER — Ambulatory Visit: Payer: Self-pay | Admitting: Family Medicine

## 2024-06-04 NOTE — Telephone Encounter (Signed)
 Copied from CRM [id number]. Topic: Clinical - Lab/Test Results >> Jun 04, 2024  1:08 PM Vanessa Gallagher DASEN wrote: Reason for CRM: returning message about lab results, need to schedule follow labs, no orders in chart- [phone]

## 2024-06-07 ENCOUNTER — Other Ambulatory Visit: Payer: Self-pay | Admitting: Family Medicine

## 2024-06-08 ENCOUNTER — Other Ambulatory Visit: Payer: Self-pay | Admitting: Family Medicine

## 2024-06-09 ENCOUNTER — Other Ambulatory Visit: Payer: Self-pay | Admitting: Family Medicine

## 2024-06-09 DIAGNOSIS — D7589 Other specified diseases of blood and blood-forming organs: Secondary | ICD-10-CM

## 2024-06-18 ENCOUNTER — Other Ambulatory Visit
